# Patient Record
Sex: Male | Born: 1960 | Race: White | Hispanic: No | Smoking: Current every day smoker
Health system: Southern US, Community
[De-identification: ages and names within clinical notes are randomized; demographics above are authoritative.]

## PROBLEM LIST (undated history)

## (undated) DIAGNOSIS — M549 Dorsalgia, unspecified: Secondary | ICD-10-CM

## (undated) DIAGNOSIS — I10 Essential (primary) hypertension: Secondary | ICD-10-CM

## (undated) DIAGNOSIS — N200 Calculus of kidney: Secondary | ICD-10-CM

## (undated) HISTORY — PX: BACK SURGERY: SHX140

## (undated) HISTORY — PX: TONSILLECTOMY: SUR1361

---

## 2005-04-03 ENCOUNTER — Emergency Department: Payer: Self-pay | Admitting: Emergency Medicine

## 2005-04-03 ENCOUNTER — Other Ambulatory Visit: Payer: Self-pay

## 2006-05-04 ENCOUNTER — Emergency Department: Payer: Self-pay | Admitting: Emergency Medicine

## 2006-05-04 ENCOUNTER — Other Ambulatory Visit: Payer: Self-pay

## 2006-11-06 ENCOUNTER — Ambulatory Visit: Payer: Self-pay | Admitting: Unknown Physician Specialty

## 2006-11-06 ENCOUNTER — Other Ambulatory Visit: Payer: Self-pay

## 2006-11-13 ENCOUNTER — Inpatient Hospital Stay: Payer: Self-pay | Admitting: Unknown Physician Specialty

## 2007-11-20 ENCOUNTER — Ambulatory Visit: Payer: Self-pay | Admitting: Unknown Physician Specialty

## 2008-03-20 ENCOUNTER — Emergency Department: Payer: Self-pay | Admitting: Emergency Medicine

## 2009-02-10 ENCOUNTER — Emergency Department: Payer: Self-pay

## 2009-07-18 ENCOUNTER — Emergency Department (HOSPITAL_COMMUNITY): Admission: EM | Admit: 2009-07-18 | Discharge: 2009-07-19 | Payer: Self-pay | Admitting: Emergency Medicine

## 2011-03-20 ENCOUNTER — Emergency Department: Payer: Self-pay | Admitting: Emergency Medicine

## 2011-03-20 LAB — CBC
HGB: 15.6 g/dL (ref 13.0–18.0)
MCHC: 34.3 g/dL (ref 32.0–36.0)
RDW: 13.2 % (ref 11.5–14.5)
WBC: 10.3 10*3/uL (ref 3.8–10.6)

## 2011-03-20 LAB — BASIC METABOLIC PANEL
BUN: 16 mg/dL (ref 7–18)
Calcium, Total: 8.8 mg/dL (ref 8.5–10.1)
EGFR (African American): >60
EGFR (Non-African Amer.): >60
Glucose: 94 mg/dL (ref 65–99)
Osmolality: 282 (ref 275–301)

## 2011-03-20 LAB — TROPONIN I: Troponin-I: <0.02 ng/mL

## 2012-02-28 ENCOUNTER — Emergency Department: Payer: Self-pay | Admitting: Emergency Medicine

## 2012-02-28 LAB — BASIC METABOLIC PANEL
BUN: 15 mg/dL (ref 7–18)
Calcium, Total: 9.3 mg/dL (ref 8.5–10.1)
Chloride: 104 mmol/L (ref 98–107)
Co2: 28 mmol/L (ref 21–32)
Creatinine: 1.21 mg/dL (ref 0.60–1.30)
EGFR (African American): >60
EGFR (Non-African Amer.): >60
Osmolality: 279 (ref 275–301)
Sodium: 138 mmol/L (ref 136–145)

## 2012-02-28 LAB — CBC
MCV: 93 fL (ref 80–100)
Platelet: 330 10*3/uL (ref 150–440)
RBC: 5.09 10*6/uL (ref 4.40–5.90)
WBC: 16.6 10*3/uL — ABNORMAL HIGH (ref 3.8–10.6)

## 2012-02-28 LAB — TROPONIN I: Troponin-I: <0.02 ng/mL

## 2012-04-25 ENCOUNTER — Emergency Department: Payer: Self-pay | Admitting: Internal Medicine

## 2012-04-25 LAB — DRUG SCREEN, URINE
Amphetamines, Ur Screen: NEGATIVE (ref ?–1000)
Barbiturates, Ur Screen: NEGATIVE (ref ?–200)
Benzodiazepine, Ur Scrn: POSITIVE (ref ?–200)
Cannabinoid 50 Ng, Ur ~~LOC~~: NEGATIVE (ref ?–50)
Cocaine Metabolite,Ur ~~LOC~~: POSITIVE (ref ?–300)
Opiate, Ur Screen: NEGATIVE (ref ?–300)
Tricyclic, Ur Screen: NEGATIVE (ref ?–1000)

## 2012-04-25 LAB — ETHANOL: Ethanol %: <0.003 % (ref 0.000–0.080)

## 2012-04-25 LAB — CBC
MCHC: 33.3 g/dL (ref 32.0–36.0)
RDW: 13 % (ref 11.5–14.5)

## 2013-11-11 ENCOUNTER — Emergency Department: Payer: Self-pay | Admitting: Emergency Medicine

## 2013-11-11 LAB — BASIC METABOLIC PANEL
ANION GAP: 5 — AB (ref 7–16)
BUN: 22 mg/dL — ABNORMAL HIGH (ref 7–18)
CALCIUM: 8.9 mg/dL (ref 8.5–10.1)
CO2: 27 mmol/L (ref 21–32)
CREATININE: 1.14 mg/dL (ref 0.60–1.30)
Chloride: 109 mmol/L — ABNORMAL HIGH (ref 98–107)
EGFR (African American): >60
EGFR (Non-African Amer.): >60
Glucose: 96 mg/dL (ref 65–99)
OSMOLALITY: 284 (ref 275–301)
POTASSIUM: 4.1 mmol/L (ref 3.5–5.1)
Sodium: 141 mmol/L (ref 136–145)

## 2013-11-11 LAB — CBC
HCT: 44.8 % (ref 40.0–52.0)
HGB: 15.1 g/dL (ref 13.0–18.0)
MCH: 32.2 pg (ref 26.0–34.0)
MCHC: 33.7 g/dL (ref 32.0–36.0)
MCV: 96 fL (ref 80–100)
Platelet: 293 10*3/uL (ref 150–440)
RBC: 4.69 10*6/uL (ref 4.40–5.90)
RDW: 13.6 % (ref 11.5–14.5)
WBC: 12.8 10*3/uL — ABNORMAL HIGH (ref 3.8–10.6)

## 2013-11-11 LAB — TROPONIN I

## 2014-01-15 ENCOUNTER — Emergency Department: Payer: Self-pay | Admitting: Emergency Medicine

## 2014-04-08 ENCOUNTER — Emergency Department: Payer: Self-pay | Admitting: Emergency Medicine

## 2014-08-15 ENCOUNTER — Other Ambulatory Visit: Payer: Self-pay

## 2014-08-15 ENCOUNTER — Emergency Department
Admission: EM | Admit: 2014-08-15 | Discharge: 2014-08-15 | Disposition: A | Discharge status: Home / Self Care | Payer: Self-pay | Attending: Emergency Medicine | Admitting: Emergency Medicine

## 2014-08-15 ENCOUNTER — Encounter: Payer: Self-pay | Admitting: Emergency Medicine

## 2014-08-15 ENCOUNTER — Emergency Department: Payer: Self-pay

## 2014-08-15 DIAGNOSIS — Z720 Tobacco use: Secondary | ICD-10-CM | POA: Insufficient documentation

## 2014-08-15 DIAGNOSIS — R51 Headache: Secondary | ICD-10-CM | POA: Insufficient documentation

## 2014-08-15 DIAGNOSIS — I10 Essential (primary) hypertension: Secondary | ICD-10-CM | POA: Insufficient documentation

## 2014-08-15 DIAGNOSIS — R0789 Other chest pain: Secondary | ICD-10-CM | POA: Insufficient documentation

## 2014-08-15 DIAGNOSIS — R519 Headache, unspecified: Secondary | ICD-10-CM

## 2014-08-15 DIAGNOSIS — R079 Chest pain, unspecified: Secondary | ICD-10-CM

## 2014-08-15 HISTORY — DX: Essential (primary) hypertension: I10

## 2014-08-15 HISTORY — DX: Dorsalgia, unspecified: M54.9

## 2014-08-15 LAB — CBC
HEMATOCRIT: 46.7 % (ref 40.0–52.0)
Hemoglobin: 15.4 g/dL (ref 13.0–18.0)
MCH: 30.6 pg (ref 26.0–34.0)
MCHC: 33 g/dL (ref 32.0–36.0)
MCV: 92.9 fL (ref 80.0–100.0)
PLATELETS: 274 10*3/uL (ref 150–440)
RBC: 5.03 MIL/uL (ref 4.40–5.90)
RDW: 14.6 % — AB (ref 11.5–14.5)
WBC: 10.8 10*3/uL — AB (ref 3.8–10.6)

## 2014-08-15 LAB — COMPREHENSIVE METABOLIC PANEL
ALBUMIN: 4.4 g/dL (ref 3.5–5.0)
ALT: 453 U/L — ABNORMAL HIGH (ref 17–63)
ANION GAP: 10 (ref 5–15)
AST: 214 U/L — ABNORMAL HIGH (ref 15–41)
Alkaline Phosphatase: 139 U/L — ABNORMAL HIGH (ref 38–126)
BILIRUBIN TOTAL: 1.1 mg/dL (ref 0.3–1.2)
BUN: 34 mg/dL — ABNORMAL HIGH (ref 6–20)
CALCIUM: 9.5 mg/dL (ref 8.9–10.3)
CHLORIDE: 106 mmol/L (ref 101–111)
CO2: 24 mmol/L (ref 22–32)
Creatinine, Ser: 1.05 mg/dL (ref 0.61–1.24)
GFR calc Af Amer: >60 mL/min (ref >60)
GFR calc non Af Amer: >60 mL/min (ref >60)
GLUCOSE: 117 mg/dL — AB (ref 65–99)
POTASSIUM: 3.8 mmol/L (ref 3.5–5.1)
Sodium: 140 mmol/L (ref 135–145)
Total Protein: 7.7 g/dL (ref 6.5–8.1)

## 2014-08-15 LAB — TROPONIN I: Troponin I: <0.03 ng/mL

## 2014-08-15 MED ORDER — LORAZEPAM 2 MG/ML IJ SOLN
INTRAMUSCULAR | Status: AC
  Filled 2014-08-15: qty 1

## 2014-08-15 MED ORDER — BUTALBITAL-APAP-CAFFEINE 50-325-40 MG PO TABS: 1.0000 | ORAL_TABLET | Freq: Four times a day (QID) | ORAL | Status: DC | PRN

## 2014-08-15 MED ORDER — DIAZEPAM 5 MG/ML IJ SOLN
10.0000 mg | Freq: Once | INTRAMUSCULAR | Status: AC
  Administered 2014-08-15: 10 mg via INTRAVENOUS

## 2014-08-15 NOTE — ED Notes (Signed)
Iv d/c'd pt requesting pain rx - dr gave one for fiorcet and f/u to pcp

## 2014-08-15 NOTE — ED Notes (Signed)
Pt agitated, jumpy, states has not slept in 2 days, med for relaxation, drifts to sleep, skin warm and dry and pink.

## 2014-08-15 NOTE — ED Provider Notes (Signed)
[facility] Emergency Department Provider Note  ____________________________________________  Time seen: Approximately 10 AM  I have reviewed the triage vital signs and the nursing notes.   HISTORY  Chief Complaint Chest Pain    HPI Johnny Combs is a 54 y.o. male with a history of back pain and hypertension who presents to the emergency department today with chest pain and a right-sided headache. 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 He says that he injects in his right arm. At 6 AM this morning he began having sharp right-sided chest pain as well as a right-sided headache which she rates as a 8 out of 10. The chest pain radiates to the left side. There is no associated shortness of breath nausea or vomiting. Eyes any abdominal pain. Headache was sudden onset. Says he is concerned because his father died of an aneurysm in his late 44s.   Past Medical History  Diagnosis Date  . Back pain   . Hypertension     There are no active problems to display for this patient.   Past Surgical History  Procedure Laterality Date  . Back surgery      Current Outpatient Rx  Name  Route  Sig  Dispense  Refill  . oxyCODONE-acetaminophen (PERCOCET) 10-325 MG per tablet   Oral   Take 1 tablet by mouth 2 (two) times daily as needed for pain.           Allergies Bee venom and Demerol  No family history on file.  Social History History  Substance Use Topics  . Smoking status: Current Every Day Smoker -- 2.00 packs/day    Types: Cigarettes  . Smokeless tobacco: Not on file  . Alcohol Use: 3.6 oz/week    6 Cans of beer per week    Review of Systems Constitutional: No fever/chills Eyes: No visual changes. ENT: No sore throat. Cardiovascular: As above  Respiratory: Denies shortness of breath. Gastrointestinal: No abdominal pain.  No nausea, no vomiting.  No diarrhea.  No  constipation. Genitourinary: Negative for dysuria. Musculoskeletal: Negative for back pain. Skin: Negative for rash. Neurological: Negative for focal weakness or numbness.  10-point ROS otherwise negative.  ____________________________________________   PHYSICAL EXAM:  VITAL SIGNS: ED Triage Vitals  Enc Vitals Group     BP 08/15/14 0936 129/76 mmHg     Pulse Rate 08/15/14 0936 90     Resp 08/15/14 0936 20     Temp 08/15/14 0936 98.1 F (36.7 C)     Temp Source 08/15/14 0936 Oral     SpO2 08/15/14 0936 97 %     Weight 08/15/14 0936 185 lb (83.915 kg)     Height 08/15/14 0936 5\' 9"  (1.753 m)     Head Cir --      Peak Flow --      Pain Score 08/15/14 0937 8     Pain Loc --      Pain Edu? --      Excl. in GC? --     Constitutional: Alert and oriented.  Eyes: Conjunctivae are normal. PERRL. EOMI. Head: Atraumatic. Nose: No congestion/rhinnorhea. Mouth/Throat: Mucous membranes are moist.  Oropharynx non-erythematous. Neck: No stridor.   Cardiovascular: Normal rate, regular rhythm. Grossly normal heart sounds.  Good peripheral circulation with intact pulses to the bilateral radials and dorsalis pedis.  Respiratory: Normal respiratory effort.  No retractions. Lungs CTAB. Gastrointestinal: Soft and nontender. No distention.  No abdominal bruits. No CVA tenderness. Musculoskeletal: No lower extremity tenderness nor edema.  No joint effusions. Neurologic:  Normal speech and language. No gross focal neurologic deficits are appreciated. Speech is normal. No gait instability. Skin:  Skin is warm, dry and intact. No rash noted. Psychiatric: Mood and affect are normal. Speech and behavior are normal.  ____________________________________________   LABS (all labs ordered are listed, but only abnormal results are displayed)  Labs Reviewed  CBC - Abnormal; Notable for the following:    WBC 10.8 (*)    RDW 14.6 (*)    All other components within normal limits  COMPREHENSIVE  METABOLIC PANEL - Abnormal; Notable for the following:    Glucose, Bld 117 (*)    BUN 34 (*)    AST 214 (*)    ALT 453 (*)    Alkaline Phosphatase 139 (*)    All other components within normal limits  TROPONIN I   ____________________________________________  EKG  ED ECG REPORT I, Arelia Longest, the attending physician, personally viewed and interpreted this ECG.   Date: 08/15/2014  EKG Time: 932  Rate: 107  Rhythm: sinus tachycardia  Axis: Normal axis  Intervals:None  ST&T Change: No ST elevations or depressions. No abnormal T-wave inversions.  ____________________________________________  RADIOLOGY  No acute intracranial findings on CT. Stable chest exam without superimposed acute process. ____________________________________________   PROCEDURES    ____________________________________________   INITIAL IMPRESSION / ASSESSMENT AND PLAN / ED COURSE  Pertinent labs & imaging results that were available during my care of the patient were reviewed by me and considered in my medical decision making (see chart for details).  ----------------------------------------- 1:14 PM on 08/15/2014 -----------------------------------------  Patient with pain relief with Valium. Feel that symptoms are largely related to his use of methamphetamine and heroin. The patient agrees with this. I offered the patient a lumbar puncture and CT angiography of the brain and he does not want those at this time. I said that I'm concerned for an aneurysm, similar to that which killed his father. However, the patient would like to go home at this time. Understands the risk of death and disability. Counseled to reduce use of recreational drugs. We'll discharge to home. ____________________________________________   FINAL CLINICAL IMPRESSION(S) / ED DIAGNOSES  Acute headache and chest pain secondary to methamphetamine use. Return visit.    Myrna Blazer, MD 08/15/14 [phone]

## 2014-08-15 NOTE — ED Notes (Addendum)
Pt reports Right side chest pain that goes down left arm that started about 0630 this am. Pt also reports right side head pain. Pt report using Meth and Heroin yesterday.

## 2014-09-10 ENCOUNTER — Emergency Department
Admission: EM | Admit: 2014-09-10 | Discharge: 2014-09-10 | Disposition: A | Discharge status: Home / Self Care | Payer: Self-pay | Attending: Emergency Medicine | Admitting: Emergency Medicine

## 2014-09-10 ENCOUNTER — Encounter: Payer: Self-pay | Admitting: Emergency Medicine

## 2014-09-10 ENCOUNTER — Emergency Department: Payer: Self-pay

## 2014-09-10 DIAGNOSIS — Z87442 Personal history of urinary calculi: Secondary | ICD-10-CM | POA: Insufficient documentation

## 2014-09-10 DIAGNOSIS — I10 Essential (primary) hypertension: Secondary | ICD-10-CM | POA: Insufficient documentation

## 2014-09-10 DIAGNOSIS — R100 Acute abdomen: Secondary | ICD-10-CM | POA: Insufficient documentation

## 2014-09-10 DIAGNOSIS — R109 Unspecified abdominal pain: Secondary | ICD-10-CM

## 2014-09-10 DIAGNOSIS — Z720 Tobacco use: Secondary | ICD-10-CM | POA: Insufficient documentation

## 2014-09-10 HISTORY — DX: Calculus of kidney: N20.0

## 2014-09-10 LAB — CBC
HEMATOCRIT: 46.7 % (ref 40.0–52.0)
HEMOGLOBIN: 15.9 g/dL (ref 13.0–18.0)
MCH: 31.2 pg (ref 26.0–34.0)
MCHC: 34 g/dL (ref 32.0–36.0)
MCV: 91.7 fL (ref 80.0–100.0)
Platelets: 274 10*3/uL (ref 150–440)
RBC: 5.1 MIL/uL (ref 4.40–5.90)
RDW: 14.7 % — ABNORMAL HIGH (ref 11.5–14.5)
WBC: 10 10*3/uL (ref 3.8–10.6)

## 2014-09-10 LAB — URINALYSIS COMPLETE WITH MICROSCOPIC (ARMC ONLY)
Bilirubin Urine: NEGATIVE
Glucose, UA: NEGATIVE mg/dL
Ketones, ur: NEGATIVE mg/dL
Nitrite: NEGATIVE
Protein, ur: NEGATIVE mg/dL
SPECIFIC GRAVITY, URINE: 1.021 (ref 1.005–1.030)
SQUAMOUS EPITHELIAL / LPF: NONE SEEN
pH: 5 (ref 5.0–8.0)

## 2014-09-10 LAB — COMPREHENSIVE METABOLIC PANEL
ALT: 233 U/L — AB (ref 17–63)
ANION GAP: 11 (ref 5–15)
AST: 100 U/L — AB (ref 15–41)
Albumin: 3.9 g/dL (ref 3.5–5.0)
Alkaline Phosphatase: 127 U/L — ABNORMAL HIGH (ref 38–126)
BILIRUBIN TOTAL: 0.6 mg/dL (ref 0.3–1.2)
BUN: 29 mg/dL — AB (ref 6–20)
CALCIUM: 9.1 mg/dL (ref 8.9–10.3)
CHLORIDE: 106 mmol/L (ref 101–111)
CO2: 21 mmol/L — AB (ref 22–32)
CREATININE: 1.06 mg/dL (ref 0.61–1.24)
GFR calc non Af Amer: >60 mL/min (ref >60)
GLUCOSE: 105 mg/dL — AB (ref 65–99)
Potassium: 4.4 mmol/L (ref 3.5–5.1)
SODIUM: 138 mmol/L (ref 135–145)
TOTAL PROTEIN: 7.5 g/dL (ref 6.5–8.1)

## 2014-09-10 MED ORDER — KETOROLAC TROMETHAMINE 60 MG/2ML IM SOLN
60.0000 mg | Freq: Once | INTRAMUSCULAR | Status: DC
  Filled 2014-09-10: qty 2

## 2014-09-10 NOTE — ED Notes (Signed)
Patient became agitated and swearing when told he could have Tylenol per Dr. Mayford Knife. Patient walked out after talking to Dr. Mayford Knife.

## 2014-09-10 NOTE — ED Provider Notes (Signed)
[facility] Emergency Department Provider Note     Time seen: ----------------------------------------- 2:50 PM on 09/10/2014 -----------------------------------------    I have reviewed the triage vital signs and the nursing notes.   HISTORY  Chief Complaint Flank Pain    HPI Johnny Combs is a 54 y.o. male who presents the ER for left flank pain and slight hematuria. Patient states he has history of kidney stones and this feels like same.Patient states his father wanted him to come to the ER to be evaluated. Pain is in the left flank, dull.. Patient states he took some Xanax at home to help the pain. States he threw up one time, has not had any diarrhea or fever.   Past Medical History  Diagnosis Date  . Back pain   . Hypertension   . Kidney stones     There are no active problems to display for this patient.   Past Surgical History  Procedure Laterality Date  . Back surgery    . Tonsillectomy      Allergies Bee venom; Demerol; and Tramadol  Social History History  Substance Use Topics  . Smoking status: Current Every Day Smoker -- 2.00 packs/day    Types: Cigarettes  . Smokeless tobacco: Not on file  . Alcohol Use: 3.6 oz/week    6 Cans of beer per week    Review of Systems Constitutional: Negative for fever. Eyes: Negative for visual changes. ENT: Negative for sore throat. Cardiovascular: Negative for chest pain. Respiratory: Negative for shortness of breath. Gastrointestinal: Positive for abdominal pain and vomiting Genitourinary: Negative for dysuria. Musculoskeletal: Negative for back pain. Skin: Negative for rash. Neurological: Negative for headaches, focal weakness or numbness.  10-point ROS otherwise negative.  ____________________________________________   PHYSICAL EXAM:  VITAL SIGNS: ED Triage Vitals  Enc Vitals Group     BP 09/10/14 1145 106/82 mmHg     Pulse Rate 09/10/14 1145 91     Resp 09/10/14  1145 18     Temp 09/10/14 1145 98.1 F (36.7 C)     Temp Source 09/10/14 1145 Oral     SpO2 09/10/14 1145 96 %     Weight 09/10/14 1145 190 lb (86.183 kg)     Height 09/10/14 1145 5\' 9"  (1.753 m)     Head Cir --      Peak Flow --      Pain Score 09/10/14 1146 8     Pain Loc --      Pain Edu? --      Excl. in GC? --     Constitutional: Alert and oriented. Patient appears drowsy, under the influence of a substance Eyes: Conjunctivae are normal. PERRL. Normal extraocular movements. ENT   Head: Normocephalic and atraumatic.   Nose: No congestion/rhinnorhea.   Mouth/Throat: Mucous membranes are moist.   Neck: No stridor. Cardiovascular: Normal rate, regular rhythm. Normal and symmetric distal pulses are present in all extremities. No murmurs, rubs, or gallops. Respiratory: Normal respiratory effort without tachypnea nor retractions. Breath sounds are clear and equal bilaterally. No wheezes/rales/rhonchi. Gastrointestinal: Soft and nontender. No distention. No abdominal bruits. There is no CVA tenderness. Musculoskeletal: Nontender with normal range of motion in all extremities. No joint effusions.  No lower extremity tenderness nor edema. Neurologic:  Normal speech and language. No gross focal neurologic deficits are appreciated. Speech is normal. No gait instability. Skin:  Skin is warm, dry and intact. No rash noted. Psychiatric: Mood and affect are normal.   ____________________________________________  ED  COURSE:  Pertinent labs & imaging results that were available during my care of the patient were reviewed by me and considered in my medical decision making (see chart for details). Patient with nonspecific symptoms, benign exam. Doubt serious etiology ____________________________________________    LABS (pertinent positives/negatives)  Labs Reviewed  COMPREHENSIVE METABOLIC PANEL - Abnormal; Notable for the following:    CO2 21 (*)    Glucose, Bld 105 (*)     BUN 29 (*)    AST 100 (*)    ALT 233 (*)    Alkaline Phosphatase 127 (*)    All other components within normal limits  CBC - Abnormal; Notable for the following:    RDW 14.7 (*)    All other components within normal limits  URINALYSIS COMPLETEWITH MICROSCOPIC ([facility] ONLY) - Abnormal; Notable for the following:    Color, Urine YELLOW (*)    APPearance HAZY (*)    Hgb urine dipstick 1+ (*)    Leukocytes, UA 1+ (*)    Bacteria, UA RARE (*)    All other components within normal limits    RADIOLOGY Images were viewed by me  KUB patient is constipated, no obvious renal stone.  ____________________________________________  FINAL ASSESSMENT AND PLAN  Abdominal pain  Plan: Patient with labs and imaging as dictated above. Patient angry and cursing because he has not received narcotics. Girlfriend is also here for pain related complaints. He is stable for outpatient follow-up.   Emily Filbert, MD   Emily Filbert, MD 09/10/14 [phone]

## 2014-09-10 NOTE — ED Notes (Signed)
C/o left flank pain and slight hematuria.  Hx kidney stones, reports this feels like same.

## 2014-11-18 ENCOUNTER — Encounter: Payer: Self-pay | Admitting: Emergency Medicine

## 2014-11-18 ENCOUNTER — Emergency Department
Admission: EM | Admit: 2014-11-18 | Discharge: 2014-11-18 | Disposition: A | Discharge status: Home / Self Care | Payer: Self-pay | Attending: Emergency Medicine | Admitting: Emergency Medicine

## 2014-11-18 DIAGNOSIS — K029 Dental caries, unspecified: Secondary | ICD-10-CM | POA: Insufficient documentation

## 2014-11-18 DIAGNOSIS — I10 Essential (primary) hypertension: Secondary | ICD-10-CM | POA: Insufficient documentation

## 2014-11-18 DIAGNOSIS — Z792 Long term (current) use of antibiotics: Secondary | ICD-10-CM | POA: Insufficient documentation

## 2014-11-18 DIAGNOSIS — K047 Periapical abscess without sinus: Secondary | ICD-10-CM

## 2014-11-18 DIAGNOSIS — Z720 Tobacco use: Secondary | ICD-10-CM | POA: Insufficient documentation

## 2014-11-18 MED ORDER — OXYCODONE-ACETAMINOPHEN 5-325 MG PO TABS: 1.0000 | ORAL_TABLET | ORAL | Status: DC | PRN

## 2014-11-18 MED ORDER — AMOXICILLIN 500 MG PO TABS: 500.0000 mg | ORAL_TABLET | Freq: Two times a day (BID) | ORAL | Status: DC

## 2014-11-18 MED ORDER — LIDOCAINE VISCOUS 2 % MT SOLN: 20.0000 mL | OROMUCOSAL | Status: AC | PRN

## 2014-11-18 NOTE — ED Notes (Signed)
Pt here with dental pain for the last several days.

## 2014-11-18 NOTE — ED Provider Notes (Signed)
[facility] Emergency Department Provider Note  ____________________________________________  Time seen: Approximately 6:07 PM  I have reviewed the triage vital signs and the nursing notes.   HISTORY  Chief Complaint No chief complaint on file.   HPI Johnny Combs is a 54 y.o. male presents for evaluation of severe dental pain in his upper gums.Denies any trauma has a history of ongoing dental infections. Has not been able to get tenderness. His pain is 10 over 10   Past Medical History  Diagnosis Date  . Back pain   . Hypertension   . Kidney stones     There are no active problems to display for this patient.   Past Surgical History  Procedure Laterality Date  . Back surgery    . Tonsillectomy      Current Outpatient Rx  Name  Route  Sig  Dispense  Refill  . amoxicillin (AMOXIL) 500 MG tablet   Oral   Take 1 tablet (500 mg total) by mouth 2 (two) times daily.   20 tablet   0   . oxyCODONE-acetaminophen (ROXICET) 5-325 MG tablet   Oral   Take 1-2 tablets by mouth every 4 (four) hours as needed for severe pain.   20 tablet   0     Allergies Bee venom; Demerol; and Tramadol  No family history on file.  Social History Social History  Substance Use Topics  . Smoking status: Current Every Day Smoker -- 2.00 packs/day    Types: Cigarettes  . Smokeless tobacco: Not on file  . Alcohol Use: 3.6 oz/week    6 Cans of beer per week    Review of Systems Constitutional: No fever/chills Eyes: No visual changes. ENT: No sore throat. Positive for dental pain come pain. Cardiovascular: Denies chest pain. Respiratory: Denies shortness of breath. Gastrointestinal: No abdominal pain.  No nausea, no vomiting.  No diarrhea.  No constipation. Genitourinary: Negative for dysuria. Musculoskeletal: Negative for back pain. Skin: Negative for rash. Neurological: Negative for headaches, focal weakness or numbness.  10-point ROS otherwise  negative.  ____________________________________________   PHYSICAL EXAM:  VITAL SIGNS: ED Triage Vitals  Enc Vitals Group     BP --      Pulse --      Resp --      Temp --      Temp src --      SpO2 --      Weight --      Height --      Head Cir --      Peak Flow --      Pain Score --      Pain Loc --      Pain Edu? --      Excl. in GC? --     Constitutional: Alert and oriented. Well appearing and in no acute distress. Nose: No congestion/rhinnorhea. Mouth/Throat: Mucous membranes are moist.  Oropharynx non-erythematous. Obvious dental caries throughout the mouth with gums are erythematous at the central upper incisors. Underneath the upper lip Neck: No stridor.   Cardiovascular: Normal rate, regular rhythm. Grossly normal heart sounds.  Good peripheral circulation. Respiratory: Normal respiratory effort.  No retractions. Lungs CTAB. Musculoskeletal: No lower extremity tenderness nor edema.  No joint effusions. Neurologic:  Normal speech and language. No gross focal neurologic deficits are appreciated. No gait instability. Skin:  Skin is warm, dry and intact. No rash noted. Psychiatric: Mood and affect are normal. Speech and behavior are normal.  ____________________________________________  LABS (all labs ordered are listed, but only abnormal results are displayed)  Labs Reviewed - No data to display ____________________________________________   PROCEDURES  Procedure(s) performed: None  Critical Care performed: No  ____________________________________________   INITIAL IMPRESSION / ASSESSMENT AND PLAN / ED COURSE  Pertinent labs & imaging results that were available during my care of the patient were reviewed by me and considered in my medical decision making (see chart for details).  Acute dental abscess. Rx given for amoxicillin 500 mg 3 times a day and Percocet 5/325. Patient follow-up with dentist, list given. Patient voices no other emergency  medical complaints at this time. ____________________________________________   FINAL CLINICAL IMPRESSION(S) / ED DIAGNOSES  Final diagnoses:  Dental abscess      Evangeline Dakin, PA-C 11/18/14 1814  Charmayne Sheer Maymunah Stegemann, PA-C 11/18/14 1814  Sharyn Creamer, MD 11/27/14 [phone]

## 2014-11-18 NOTE — Discharge Instructions (Signed)
Dental Abscess °A dental abscess is a collection of infected fluid (pus) from a bacterial infection in the inner part of the tooth (pulp). It usually occurs at the end of the tooth's root.  °CAUSES  °· Severe tooth decay. °· Trauma to the tooth that allows bacteria to enter into the pulp, such as a broken or chipped tooth. °SYMPTOMS  °· Severe pain in and around the infected tooth. °· Swelling and redness around the abscessed tooth or in the mouth or face. °· Tenderness. °· Pus drainage. °· Bad breath. °· Bitter taste in the mouth. °· Difficulty swallowing. °· Difficulty opening the mouth. °· Nausea. °· Vomiting. °· Chills. °· Swollen neck glands. °DIAGNOSIS  °· A medical and dental history will be taken. °· An examination will be performed by tapping on the abscessed tooth. °· X-rays may be taken of the tooth to identify the abscess. °TREATMENT °The goal of treatment is to eliminate the infection. You may be prescribed antibiotic medicine to stop the infection from spreading. A root canal may be performed to save the tooth. If the tooth cannot be saved, it may be pulled (extracted) and the abscess may be drained.  °HOME CARE INSTRUCTIONS °· Only take over-the-counter or prescription medicines for pain, fever, or discomfort as directed by your caregiver. °· Rinse your mouth (gargle) often with salt water (¼ tsp salt in 8 oz [250 ml] of warm water) to relieve pain or swelling. °· Do not drive after taking pain medicine (narcotics). °· Do not apply heat to the outside of your face. °· Return to your dentist for further treatment as directed. °SEEK MEDICAL CARE IF: °· Your pain is not helped by medicine. °· Your pain is getting worse instead of better. °SEEK IMMEDIATE MEDICAL CARE IF: °· You have a fever or persistent symptoms for more than 2-3 days. °· You have a fever and your symptoms suddenly get worse. °· You have chills or a very bad headache. °· You have problems breathing or swallowing. °· You have trouble  opening your mouth. °· You have swelling in the neck or around the eye. °Document Released: 02/06/2005 Document Revised: 11/01/2011 Document Reviewed: 05/17/2010 °ExitCare® Patient Information ©2015 [employer]. This information is not intended to replace advice given to you by your health care provider. Make sure you discuss any questions you have with your health care provider. ° ° °OPTIONS FOR DENTAL FOLLOW UP CARE ° °[place] Department of Health and Human Services - Local Safety Net Dental Clinics °http://www.ncdhhs.gov/dph/oralhealth/services/safetynetclinics.htm °  °[facility] (336-562-3123) ° °[facility] [place] (919-933-9087) ° °[facility] (919-663-1744 ext [phone]) ° °[facility] (336-570-6415) ° °[facility] (919-968-2025) °This clinic caters to the indigent population and is on a lottery system. °Location: °[facility], [address], [place] °Clinic Hours: °Wednesdays from 6pm - 9pm, patients seen by a lottery system. °For dates, call or go to www.med.[facility].edu/[facility]/patients/Dental-[facility] °Services: °Cleanings, fillings and simple extractions. °Payment Options: °DENTAL WORK IS FREE OF CHARGE. Bring proof of income or support. °Best way to get seen: °Arrive at 5:15 pm - this is a lottery, NOT first come/first serve, so arriving earlier will not increase your chances of being seen. °  °  °[facility] °919-537-3737 °Select option 1 for emergencies °  °Location: °[facility], [address], [place] °Clinic Hours: °No walk-ins accepted - call the day before to schedule an appointment. °Check in times   are 9:30 am and 1:30 pm. °Services: °Simple extractions, temporary fillings, pulpectomy/pulp debridement, uncomplicated abscess drainage. °Payment Options: °PAYMENT IS DUE AT THE TIME OF SERVICE.  Fee is usually $100-200, additional surgical procedures (e.g. abscess drainage) may  be extra. °Cash, checks, Visa/MasterCard accepted.  Can file Medicaid if patient is covered for dental - patient should call case worker to check. °No discount for [facility] patients. °Best way to get seen: °MUST call the day before and get onto the schedule. Can usually be seen the next 1-2 days. No walk-ins accepted. °  °  °[facility] °919-933-9087 °  °Location: °[facility], [address], [place] °Clinic Hours: °M, W, Th, F 8am or 1:30pm, Tues 9a or 1:30 - first come/first served. °Services: °Simple extractions, temporary fillings, uncomplicated abscess drainage.  You do not need to be an [place] resident. °Payment Options: °PAYMENT IS DUE AT THE TIME OF SERVICE. °Dental insurance, otherwise sliding scale - bring proof of income or support. °Depending on income and treatment needed, cost is usually $50-200. °Best way to get seen: °Arrive early as it is first come/first served. °  °  °[facility] °919-542-1641 °  °Location: °[address] °Clinic Hours: °Mon-Thu 8a-5p °Services: °Most basic dental services including extractions and fillings. °Payment Options: °PAYMENT IS DUE AT THE TIME OF SERVICE. °Sliding scale, up to 50% off - bring proof if income or support. °Medicaid with dental option accepted. °Best way to get seen: °Call to schedule an appointment, can usually be seen within 2 weeks OR they will try to see walk-ins - show up at 8a or 2p (you may have to wait). °  °  °[facility] °919-245-2435 °[place] RESIDENTS ONLY °  °Location: °[facility], [address], [place], [place] [address] °Clinic Hours: By appointment only. °Monday - Thursday 8am-5pm, Friday 8am-12pm °Services: Cleanings, fillings, extractions. °Payment Options: °PAYMENT IS DUE AT THE TIME OF SERVICE. °Cash, Visa or MasterCard. Sliding scale - $30 minimum per service. °Best way to get seen: °Come in to  office, complete packet and make an appointment - need proof of income °or support monies for each household member and proof of [place] residence. °Usually takes about a month to get in. °  °  °[facility] °[phone] °  °Location: °[address] [place] °Clinic Hours: Walk-in Urgent Care Dental Services are offered Monday-Friday mornings only. °The numbers of emergencies accepted daily is limited to the number of °providers available. °Maximum 15 - Mondays, Wednesdays & Thursdays °Maximum 10 - Tuesdays & Fridays °Services: °You do not need to be a [place] resident to be seen for a dental emergency. °Emergencies are defined as pain, swelling, abnormal bleeding, or dental trauma. Walkins will receive x-rays if needed. °NOTE: Dental cleaning is not an emergency. °Payment Options: °PAYMENT IS DUE AT THE TIME OF SERVICE. °Minimum co-pay is $40.00 for uninsured patients. °Minimum co-pay is $3.00 for Medicaid with dental coverage. °Dental Insurance is accepted and must be presented at time of visit. °Medicare does not cover dental. °Forms of payment: Cash, credit card, checks. °Best way to get seen: °If not previously registered with the clinic, walk-in dental registration begins at 7:15 am and is on a first come/first serve basis. °If previously registered with the clinic, call to make an appointment. °  °  °[facility] °919-776-4359 °[place] RESIDENTS ONLY °  °Location: °[address],   [place], [place] °Clinic Hours: °Mon-Thu 10a-2p °Services: Extractions only! °Payment Options: °FREE (donations accepted) - bring proof of income or support °Best way to get seen: °Call and schedule an appointment OR come at 8am on the 1st Monday of every month (except for holidays) when it is first come/first served. °  °  °[facility] °[phone] °  °Location: °[address], [place] °Clinic Hours: °Friday mornings °Services, Payment Options, Best way to get  seen: °Call for info °

## 2014-11-20 ENCOUNTER — Emergency Department
Admission: EM | Admit: 2014-11-20 | Discharge: 2014-11-20 | Disposition: A | Discharge status: Home / Self Care | Payer: Self-pay | Attending: Emergency Medicine | Admitting: Emergency Medicine

## 2014-11-20 ENCOUNTER — Emergency Department: Payer: Self-pay

## 2014-11-20 ENCOUNTER — Encounter: Payer: Self-pay | Admitting: Emergency Medicine

## 2014-11-20 DIAGNOSIS — R509 Fever, unspecified: Secondary | ICD-10-CM | POA: Insufficient documentation

## 2014-11-20 DIAGNOSIS — K088 Other specified disorders of teeth and supporting structures: Secondary | ICD-10-CM | POA: Insufficient documentation

## 2014-11-20 DIAGNOSIS — H538 Other visual disturbances: Secondary | ICD-10-CM | POA: Insufficient documentation

## 2014-11-20 DIAGNOSIS — I10 Essential (primary) hypertension: Secondary | ICD-10-CM | POA: Insufficient documentation

## 2014-11-20 DIAGNOSIS — R0602 Shortness of breath: Secondary | ICD-10-CM | POA: Insufficient documentation

## 2014-11-20 DIAGNOSIS — Z720 Tobacco use: Secondary | ICD-10-CM | POA: Insufficient documentation

## 2014-11-20 DIAGNOSIS — R1110 Vomiting, unspecified: Secondary | ICD-10-CM | POA: Insufficient documentation

## 2014-11-20 DIAGNOSIS — K0889 Other specified disorders of teeth and supporting structures: Secondary | ICD-10-CM

## 2014-11-20 DIAGNOSIS — R51 Headache: Secondary | ICD-10-CM | POA: Insufficient documentation

## 2014-11-20 DIAGNOSIS — R040 Epistaxis: Secondary | ICD-10-CM | POA: Insufficient documentation

## 2014-11-20 LAB — CBC WITH DIFFERENTIAL/PLATELET
Basophils Absolute: 0.1 10*3/uL (ref 0–0.1)
Basophils Relative: 2 %
Eosinophils Absolute: 0.4 10*3/uL (ref 0–0.7)
Eosinophils Relative: 7 %
HCT: 43.7 % (ref 40.0–52.0)
Hemoglobin: 15.2 g/dL (ref 13.0–18.0)
Lymphocytes Relative: 54 %
Lymphs Abs: 3 10*3/uL (ref 1.0–3.6)
MCH: 32.1 pg (ref 26.0–34.0)
MCHC: 34.8 g/dL (ref 32.0–36.0)
MCV: 92.3 fL (ref 80.0–100.0)
Monocytes Absolute: 1.1 10*3/uL — ABNORMAL HIGH (ref 0.2–1.0)
Monocytes Relative: 20 %
Neutro Abs: 1 10*3/uL — ABNORMAL LOW (ref 1.4–6.5)
Neutrophils Relative %: 17 %
Platelets: 219 10*3/uL (ref 150–440)
RBC: 4.73 MIL/uL (ref 4.40–5.90)
RDW: 13.3 % (ref 11.5–14.5)
WBC: 5.5 10*3/uL (ref 3.8–10.6)

## 2014-11-20 LAB — BASIC METABOLIC PANEL
ANION GAP: 5 (ref 5–15)
BUN: 28 mg/dL — ABNORMAL HIGH (ref 6–20)
CHLORIDE: 106 mmol/L (ref 101–111)
CO2: 26 mmol/L (ref 22–32)
CREATININE: 1.03 mg/dL (ref 0.61–1.24)
Calcium: 9.1 mg/dL (ref 8.9–10.3)
GFR calc non Af Amer: >60 mL/min (ref >60)
Glucose, Bld: 122 mg/dL — ABNORMAL HIGH (ref 65–99)
Potassium: 3.6 mmol/L (ref 3.5–5.1)
SODIUM: 137 mmol/L (ref 135–145)

## 2014-11-20 MED ORDER — SULFAMETHOXAZOLE-TRIMETHOPRIM 800-160 MG PO TABS: 1.0000 | ORAL_TABLET | Freq: Two times a day (BID) | ORAL | Status: AC

## 2014-11-20 MED ORDER — OXYCODONE-ACETAMINOPHEN 10-325 MG PO TABS: 1.0000 | ORAL_TABLET | Freq: Four times a day (QID) | ORAL | Status: DC | PRN

## 2014-11-20 MED ORDER — HYDROMORPHONE HCL 1 MG/ML IJ SOLN
1.0000 mg | Freq: Once | INTRAMUSCULAR | Status: AC
  Administered 2014-11-20: 1 mg via INTRAVENOUS
  Filled 2014-11-20: qty 1

## 2014-11-20 MED ORDER — IOHEXOL 300 MG/ML  SOLN
75.0000 mL | Freq: Once | INTRAMUSCULAR | Status: AC | PRN
  Administered 2014-11-20: 75 mL via INTRAVENOUS
  Filled 2014-11-20: qty 75

## 2014-11-20 NOTE — ED Notes (Signed)
Lab notified to add blood work BMP

## 2014-11-20 NOTE — ED Notes (Signed)
AAOx3.  Skin warm and dry.  NAD. Ambulates with easy and steady gait.  Moving all extremities equally and strong. 

## 2014-11-20 NOTE — ED Provider Notes (Addendum)
[facility] Emergency Department Provider Note  ____________________________________________  Time seen: Approximately 4:20 PM  I have reviewed the triage vital signs and the nursing notes.   HISTORY  Chief Complaint Oral Swelling   HPI Johnny Combs is a 54 y.o. male presenting with oral swelling and dental pain. Patient was seen two days ago in the ED for dental abscess when he was prescribed amoxicillin and percocet. Patient did not follow up with dentist because "I was stubborn and wanted to finish my work project." He is hopeful that he can make an appointment with a dentist on Monday. Since his visit two days ago, he has had continued pain 8/10 "all over" mouth and face. Patient states that he has increased swelling. Patient finds it difficult to eat and drink. He is also experiencing fever (100F yesterday), chills, blurry vision, epistaxis on the right, vomiting x 2, and SOB. Rx Percocet and Lidocaine did not relieve the pain. Patient also tried advil and tylenol, but these did not relieve the pain.    Past Medical History  Diagnosis Date  . Back pain   . Hypertension   . Kidney stones     There are no active problems to display for this patient.   Past Surgical History  Procedure Laterality Date  . Back surgery    . Tonsillectomy      Current Outpatient Rx  Name  Route  Sig  Dispense  Refill  . lidocaine (XYLOCAINE) 2 % solution   Mouth/Throat   Use as directed 20 mLs in the mouth or throat as needed for mouth pain (apply to affected gums as needed for pain.).   100 mL   0   . oxyCODONE-acetaminophen (PERCOCET) 10-325 MG tablet   Oral   Take 1 tablet by mouth every 6 (six) hours as needed for pain.   20 tablet   0   . sulfamethoxazole-trimethoprim (BACTRIM DS,SEPTRA DS) 800-160 MG tablet   Oral   Take 1 tablet by mouth 2 (two) times daily.   20 tablet   0     Allergies Bee venom; Demerol; and Tramadol  No family history on  file.  Social History Social History  Substance Use Topics  . Smoking status: Current Every Day Smoker -- 2.00 packs/day    Types: Cigarettes  . Smokeless tobacco: None  . Alcohol Use: 3.6 oz/week    6 Cans of beer per week    Review of Systems Constitutional: Positive for fever/chills Eyes: Positive for blurry vision. ENT: Positive for throat swelling and sore throat. Cardiovascular: Denies chest pain. Positive palpitations with pain.  Respiratory: Positive for shortness of breath, especially when pain is severe Gastrointestinal: No abdominal pain.  No nausea. Positive for vomiting x2.  No diarrhea.  No constipation. Genitourinary: Negative for dysuria. Musculoskeletal: Negative for back pain. Skin: Negative for rash. Neurological: Positive for headaches. No focal weakness or numbness.  10-point ROS otherwise negative.  ____________________________________________   PHYSICAL EXAM:  VITAL SIGNS: ED Triage Vitals  Enc Vitals Group     BP 11/20/14 1601 130/87 mmHg     Pulse Rate 11/20/14 1601 93     Resp 11/20/14 1601 20     Temp 11/20/14 1601 97.6 F (36.4 C)     Temp Source 11/20/14 1601 Oral     SpO2 11/20/14 1601 97 %     Weight 11/20/14 1601 185 lb (83.915 kg)     Height 11/20/14 1601  (1.778 m)  Head Cir --      Peak Flow --      Pain Score 11/20/14 1603 8     Pain Loc --      Pain Edu? --      Excl. in GC? --     Constitutional: Alert and oriented. Well appearing and in no acute distress. Eyes: Conjunctivae are normal. PERRL. EOMI. Head: Swelling noted to face particularly around mouth. No erythema or bruising noted Nose: Dried blood noted in right nostril.  Mouth/Throat: Mucous membranes are moist.  Dental caries noted. Upper gums appear erythematous. Patient tender to the touch at front and underneath top lip. Oropharynx non-erythematous. Neck: No stridor. Tender to palpation. Cervical LAD.  Cardiovascular: Normal rate, regular rhythm. Grossly  normal heart sounds.  Good peripheral circulation. Respiratory: Normal respiratory effort.  No retractions. Lungs CTAB. Musculoskeletal: No lower extremity tenderness nor edema.  No joint effusions. Neurologic:  Normal speech and language. No gross focal neurologic deficits are appreciated. No gait instability. Skin:  Skin is warm, dry and intact. No rash noted. Psychiatric: Mood and affect are normal. Speech and behavior are normal.  ____________________________________________   LABS (all labs ordered are listed, but only abnormal results are displayed)  Labs Reviewed  CBC WITH DIFFERENTIAL/PLATELET - Abnormal; Notable for the following:    Neutro Abs 1.0 (*)    Monocytes Absolute 1.1 (*)    All other components within normal limits  BASIC METABOLIC PANEL - Abnormal; Notable for the following:    Glucose, Bld 122 (*)    BUN 28 (*)    All other components within normal limits   ____________________________________________   RADIOLOGY  CT Maxillofacial With Contrast: No facial abscess noted. ____________________________________________   PROCEDURES  Procedure(s) performed: None  Critical Care performed: No  ____________________________________________   INITIAL IMPRESSION / ASSESSMENT AND PLAN / ED COURSE  Pertinent labs & imaging results that were available during my care of the patient were reviewed by me and considered in my medical decision making (see chart for details).  54 year old male presenting with continued dental pain and oral swelling since diagnosis of dental abscess two days ago in the ED. Patient has been taking Amoxicillin and Percocet. Pain has continued and swelling has worsened along with development of chills, vomiting, blurry vision, and SOB. Patient has not followed up with dentist.   In the ED, 1 mg Dilaudid IV administered for pain relief. CBC shows. CT Maxillofacial shows no facial abscess. Rx given for Percocet 10/325 #20 and Bactrim DS  twice a day #20. Patient to follow up with dentist on Monday as scheduled.  Patient voices no other emergency medical complaints at this time. Patient to follow up with dentist.  Patient to return to ED if symptoms continue or worsen.  ____________________________________________   FINAL CLINICAL IMPRESSION(S) / ED DIAGNOSES  Final diagnoses:  Pain, dental      Evangeline Dakin, PA-C 11/20/14 1845  Myrna Blazer, MD 11/22/14 0033  Evangeline Dakin, PA-C 11/24/14 1525  Myrna Blazer, MD 11/24/14 [phone]

## 2014-11-20 NOTE — ED Notes (Signed)
Says seen here for dental problems few days ago and was told to return if worse.  Pt with visible swelling upper jaw bilateral.   Pt in nad.

## 2014-11-20 NOTE — ED Notes (Signed)
Increased pain since Wednesday. 10/10 pain. Patient has been taking prescribed medications.

## 2014-11-20 NOTE — Discharge Instructions (Signed)
OPTIONS FOR DENTAL FOLLOW UP CARE ° °[place] [facility] and [facility] - Local Safety Net Dental Clinics °http://www.ncdhhs.gov/dph/oralhealth/services/safetynetclinics.htm °  °[facility] (336-562-3123) ° °[facility] [place] (919-933-9087) ° °[facility] ([phone] ext [phone]) ° °[facility] ([phone]) ° °[facility] ([phone]) °This clinic caters to the indigent population and is on a lottery system. °Location: °[facility], [address], [place] °Clinic Hours: °Wednesdays from 6pm - 9pm, patients seen by a lottery system. °For dates, call or go to [email or web address]/[facility]/patients/Dental-[facility] °Services: °Cleanings, fillings and simple extractions. °Payment Options: °DENTAL WORK IS FREE OF CHARGE. Bring proof of income or support. °Best way to get seen: °Arrive at 5:15 pm - this is a lottery, NOT first come/first serve, so arriving earlier will not increase your chances of being seen. °  °  °[facility] °[phone] °Select option 1 for emergencies °  °Location: °[facility], [address], [place] °Clinic Hours: °No walk-ins accepted - call the day before to schedule an appointment. °Check in times are 9:30 am and 1:30 pm. °Services: °Simple extractions, temporary fillings, pulpectomy/pulp debridement, uncomplicated abscess drainage. °Payment Options: °PAYMENT IS DUE AT THE TIME OF SERVICE.  Fee is usually $100-200, additional surgical procedures (e.g. abscess drainage) may be extra. °Cash, checks, Visa/MasterCard accepted.  Can file Medicaid if patient is covered for dental - patient should call case worker to check. °No discount for [facility] patients. °Best way to get seen: °MUST call the day before and get onto the schedule. Can usually be seen the next 1-2 days. No walk-ins accepted. °  °  °[facility] °[phone] °   °Location: °[facility], [address], [place] °Clinic Hours: °M, W, Th, F 8am or 1:30pm, Tues 9a or 1:30 - first come/first served. °Services: °Simple extractions, temporary fillings, uncomplicated abscess drainage.  You do not need to be an [place] resident. °Payment Options: °PAYMENT IS DUE AT THE TIME OF SERVICE. °Dental insurance, otherwise sliding scale - bring proof of income or support. °Depending on income and treatment needed, cost is usually $50-200. °Best way to get seen: °Arrive early as it is first come/first served. °  °  °[facility] °[phone] °  °Location: °[address] °Clinic Hours: °Mon-Thu 8a-5p °Services: °Most basic dental services including extractions and fillings. °Payment Options: °PAYMENT IS DUE AT THE TIME OF SERVICE. °Sliding scale, up to 50% off - bring proof if income or support. °Medicaid with dental option accepted. °Best way to get seen: °Call to schedule an appointment, can usually be seen within 2 weeks OR they will try to see walk-ins - show up at 8a or 2p (you may have to wait). °  °  °[facility] °[phone] °[place] RESIDENTS ONLY °  °Location: °[facility], [address], [place],  [address] °Clinic Hours: By appointment only. °Monday - Thursday 8am-5pm, Friday 8am-12pm °Services: Cleanings, fillings, extractions. °Payment Options: °PAYMENT IS DUE AT THE TIME OF SERVICE. °Cash, Visa or MasterCard. Sliding scale - $30 minimum per service. °Best way to get seen: °Come in to office, complete packet and make an appointment - need proof of income °or support monies for each household member and proof of [place] residence. °Usually takes about a month to get in. °  °  °[facility] °[phone] °  °Location: °[address]   [place] °Clinic Hours: Walk-in Urgent Care Dental Services are offered Monday-Friday  mornings only. °The numbers of emergencies accepted daily is limited to the number of °providers available. °Maximum 15 - Mondays, Wednesdays & Thursdays °Maximum 10 - Tuesdays & Fridays °Services: °You do not need to be a [place] resident to be seen for a dental emergency. °Emergencies are defined as pain, swelling, abnormal bleeding, or dental trauma. Walkins will receive x-rays if needed. °NOTE: Dental cleaning is not an emergency. °Payment Options: °PAYMENT IS DUE AT THE TIME OF SERVICE. °Minimum co-pay is $40.00 for uninsured patients. °Minimum co-pay is $3.00 for Medicaid with dental coverage. °Dental Insurance is accepted and must be presented at time of visit. °Medicare does not cover dental. °Forms of payment: Cash, credit card, checks. °Best way to get seen: °If not previously registered with the clinic, walk-in dental registration begins at 7:15 am and is on a first come/first serve basis. °If previously registered with the clinic, call to make an appointment. °  °  °[facility] °[phone] °[place] RESIDENTS ONLY °  °Location: °[address], [place], [place] °Clinic Hours: °Mon-Thu 10a-2p °Services: Extractions only! °Payment Options: °FREE (donations accepted) - bring proof of income or support °Best way to get seen: °Call and schedule an appointment OR come at 8am on the 1st Monday of every month (except for holidays) when it is first come/first served. °  °  °[facility] °[phone] °  °Location: °[address], [place] °Clinic Hours: °Friday mornings °Services, Payment Options, Best way to get seen: °Call for info °Dental Pain °A tooth ache may be caused by cavities (tooth decay). Cavities expose the nerve of the tooth to air and hot or cold temperatures. It may come from an infection or abscess (also called a boil or furuncle) around your tooth. It is also often caused by dental caries (tooth decay). This causes the pain you are having. °DIAGNOSIS  °Your caregiver can  diagnose this problem by exam. °TREATMENT  °· If caused by an infection, it may be treated with medications which kill germs (antibiotics) and pain medications as prescribed by your caregiver. Take medications as directed. °· Only take over-the-counter or prescription medicines for pain, discomfort, or fever as directed by your caregiver. °· Whether the tooth ache today is caused by infection or dental disease, you should see your dentist as soon as possible for further care. °SEEK MEDICAL CARE IF: °The exam and treatment you received today has been provided on an emergency basis only. This is not a substitute for complete medical or dental care. If your problem worsens or new problems (symptoms) appear, and you are unable to meet with your dentist, call or return to this location. °SEEK IMMEDIATE MEDICAL CARE IF:  °· You have a fever. °· You develop redness and swelling of your face, jaw, or neck. °· You are unable to open your mouth. °· You have severe pain uncontrolled by pain medicine. °MAKE SURE YOU:  °· Understand these instructions. °· Will watch your condition. °· Will get help right away if you are not doing well or get worse. °Document Released: 02/06/2005 Document Revised: 05/01/2011 Document Reviewed: 09/25/2007 °ExitCare® Patient Information ©2015 [employer]. This information is not intended to replace advice given to you by your health care provider. Make sure you discuss any questions you have with your health care provider. ° °

## 2015-02-04 ENCOUNTER — Emergency Department: Payer: Self-pay

## 2015-02-04 ENCOUNTER — Emergency Department
Admission: EM | Admit: 2015-02-04 | Discharge: 2015-02-04 | Disposition: A | Discharge status: Home / Self Care | Payer: Self-pay | Attending: Emergency Medicine | Admitting: Emergency Medicine

## 2015-02-04 ENCOUNTER — Encounter: Payer: Self-pay | Admitting: Emergency Medicine

## 2015-02-04 DIAGNOSIS — S299XXA Unspecified injury of thorax, initial encounter: Secondary | ICD-10-CM | POA: Insufficient documentation

## 2015-02-04 DIAGNOSIS — F17210 Nicotine dependence, cigarettes, uncomplicated: Secondary | ICD-10-CM | POA: Insufficient documentation

## 2015-02-04 DIAGNOSIS — S86812A Strain of other muscle(s) and tendon(s) at lower leg level, left leg, initial encounter: Secondary | ICD-10-CM | POA: Insufficient documentation

## 2015-02-04 DIAGNOSIS — Y9289 Other specified places as the place of occurrence of the external cause: Secondary | ICD-10-CM | POA: Insufficient documentation

## 2015-02-04 DIAGNOSIS — I10 Essential (primary) hypertension: Secondary | ICD-10-CM | POA: Insufficient documentation

## 2015-02-04 DIAGNOSIS — W01198A Fall on same level from slipping, tripping and stumbling with subsequent striking against other object, initial encounter: Secondary | ICD-10-CM | POA: Insufficient documentation

## 2015-02-04 DIAGNOSIS — Z79899 Other long term (current) drug therapy: Secondary | ICD-10-CM | POA: Insufficient documentation

## 2015-02-04 DIAGNOSIS — Y9389 Activity, other specified: Secondary | ICD-10-CM | POA: Insufficient documentation

## 2015-02-04 DIAGNOSIS — Y998 Other external cause status: Secondary | ICD-10-CM | POA: Insufficient documentation

## 2015-02-04 DIAGNOSIS — S8392XA Sprain of unspecified site of left knee, initial encounter: Secondary | ICD-10-CM | POA: Insufficient documentation

## 2015-02-04 MED ORDER — HYDROCODONE-ACETAMINOPHEN 5-325 MG PO TABS
2.0000 | ORAL_TABLET | Freq: Once | ORAL | Status: AC
Start: 2015-02-04 — End: 2015-02-04
  Administered 2015-02-04: 2 via ORAL

## 2015-02-04 MED ORDER — HYDROCODONE-ACETAMINOPHEN 5-325 MG PO TABS
ORAL_TABLET | ORAL | Status: AC
Start: 2015-02-04 — End: 2015-02-04
  Administered 2015-02-04: 2 via ORAL
  Filled 2015-02-04: qty 2

## 2015-02-04 MED ORDER — HYDROCODONE-ACETAMINOPHEN 5-325 MG PO TABS: 1.0000 | ORAL_TABLET | ORAL | Status: DC | PRN

## 2015-02-04 MED ORDER — KETOROLAC TROMETHAMINE 30 MG/ML IJ SOLN
30.0000 mg | Freq: Once | INTRAMUSCULAR | Status: AC
  Administered 2015-02-04: 30 mg via INTRAMUSCULAR

## 2015-02-04 MED ORDER — KETOROLAC TROMETHAMINE 30 MG/ML IJ SOLN
INTRAMUSCULAR | Status: AC
  Administered 2015-02-04: 30 mg via INTRAMUSCULAR
  Filled 2015-02-04: qty 1

## 2015-02-04 NOTE — ED Provider Notes (Signed)
[facility] Emergency Department Provider Note  ____________________________________________  Time seen: On arrival  I have reviewed the triage vital signs and the nursing notes.   HISTORY  Chief Complaint Rib Injury and Knee Pain    HPI Johnny Combs is a 54 y.o. male who is building a deck and stepped through some floor joists and landed on his left chest just prior to arrival. He reports his knee also flexed forward. He complains of left anterior chest which is severe with her sleeping pain with deep breathing as well as mild left knee pain although he is ambulatory. He does smoke cigarettes. He denies other injuries. No abdominal pain, no nausea or vomiting.     Past Medical History  Diagnosis Date  . Back pain   . Hypertension   . Kidney stones     There are no active problems to display for this patient.   Past Surgical History  Procedure Laterality Date  . Back surgery    . Tonsillectomy      Current Outpatient Rx  Name  Route  Sig  Dispense  Refill  . lidocaine (XYLOCAINE) 2 % solution   Mouth/Throat   Use as directed 20 mLs in the mouth or throat as needed for mouth pain (apply to affected gums as needed for pain.).   100 mL   0   . oxyCODONE-acetaminophen (PERCOCET) 10-325 MG tablet   Oral   Take 1 tablet by mouth every 6 (six) hours as needed for pain.   20 tablet   0   . sulfamethoxazole-trimethoprim (BACTRIM DS,SEPTRA DS) 800-160 MG tablet   Oral   Take 1 tablet by mouth 2 (two) times daily.   20 tablet   0     Allergies Bee venom; Demerol; and Tramadol  No family history on file.  Social History Social History  Substance Use Topics  . Smoking status: Current Every Day Smoker -- 2.00 packs/day    Types: Cigarettes  . Smokeless tobacco: None  . Alcohol Use: 3.6 oz/week    6 Cans of beer per week    Review of Systems  Constitutional: Negative for fever. Eyes: Negative for visual changes. ENT: Negative  for sore throat Cardiovascular: As above Respiratory: Pain with deep breathing Gastrointestinal: Negative for abdominal pain, vomiting and diarrhea. Genitourinary: Negative for dysuria. Musculoskeletal: Negative for back pain. Mild left knee discomfort Skin: Negative for rash. Neurological: Negative for headaches or focal weakness Psychiatric: No anxiety    ____________________________________________   PHYSICAL EXAM:  VITAL SIGNS: ED Triage Vitals  Enc Vitals Group     BP 02/04/15 1403 142/105 mmHg     Pulse Rate 02/04/15 1403 81     Resp 02/04/15 1403 18     Temp 02/04/15 1403 98.2 F (36.8 C)     Temp Source 02/04/15 1403 Oral     SpO2 02/04/15 1403 94 %     Weight 02/04/15 1403 185 lb (83.915 kg)     Height 02/04/15 1403 5\' 10"  (1.778 m)     Head Cir --      Peak Flow --      Pain Score 02/04/15 1403 10     Pain Loc --      Pain Edu? --      Excl. in GC? --      Constitutional: Alert and oriented. Well appearing and in no distress. Eyes: Conjunctivae are normal.  ENT   Head: Normocephalic and atraumatic.   Mouth/Throat: Mucous membranes  are moist. Cardiovascular: Normal rate, regular rhythm. Normal and symmetric distal pulses are present in all extremities. No murmurs, rubs, or gallops. Tenderness to palpation in the left lower anterior chest which is significant Respiratory: Normal respiratory effort without tachypnea nor retractions. Breath sounds are clear and equal bilaterally.  Gastrointestinal: Soft and non-tender in all quadrants. No distention. There is no CVA tenderness. Genitourinary: deferred Musculoskeletal: Nontender with normal range of motion in all extremities. No lower extremity tenderness nor edema. Left knee exam is unremarkable, ligaments intact, no swelling Neurologic:  Normal speech and language. No gross focal neurologic deficits are appreciated. Skin:  Skin is warm, dry and intact. No rash noted. Psychiatric: Mood and affect are  normal. Patient exhibits appropriate insight and judgment.  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  Despite normal x-ray of the chest patient has significant tenderness we will send for non-con CT chest for further evaluation  CT shows no rib fractures or pulmonary contusion. Discussed incidental findings as above. Recommend ice, pain medication and rest for likely chest wall contusion and knee sprain  ____________________________________________   FINAL CLINICAL IMPRESSION(S) / ED DIAGNOSES  Final diagnoses:  Chest wall injury, initial encounter  Knee sprain and strain, left, initial encounter     Jene Every, MD 02/04/15 [phone]

## 2015-02-04 NOTE — ED Notes (Signed)
Pt states he tripped over some boards at work and tripped, states left sided rib pain and left knee pain, states difficulty breathing when he lies down, MD at bedside

## 2015-02-04 NOTE — ED Notes (Signed)
Pt presents with left rib pain and left knee pain after falling on a floor joist. Pt states it hurts to breath and feels like his lungs are filling with fluid when he lies down.

## 2015-02-04 NOTE — Discharge Instructions (Signed)
No broken bones were seen on CT scan of your chest there is a slightly abnormal appearance of your right middle lobe lung which needs to be further evaluated by bronchoscopy. Please follow-up with your primary care physician to arrange this   Blunt Chest Trauma Blunt chest trauma is an injury caused by a blow to the chest. These chest injuries can be very painful. Blunt chest trauma often results in bruised or broken (fractured) ribs. Most cases of bruised and fractured ribs from blunt chest traumas get better after 1 to 3 weeks of rest and pain medicine. Often, the soft tissue in the chest wall is also injured, causing pain and bruising. Internal organs, such as the heart and lungs, may also be injured. Blunt chest trauma can lead to serious medical problems. This injury requires immediate medical care. CAUSES   Motor vehicle collisions.  Falls.  Physical violence.  Sports injuries. SYMPTOMS   Chest pain. The pain may be worse when you move or breathe deeply.  Shortness of breath.  Lightheadedness.  Bruising.  Tenderness.  Swelling. DIAGNOSIS  Your caregiver will do a physical exam. X-rays may be taken to look for fractures. However, minor rib fractures may not show up on X-rays until a few days after the injury. If a more serious injury is suspected, further imaging tests may be done. This may include ultrasounds, computed tomography (CT) scans, or magnetic resonance imaging (MRI). TREATMENT  Treatment depends on the severity of your injury. Your caregiver may prescribe pain medicines and deep breathing exercises. HOME CARE INSTRUCTIONS  Limit your activities until you can move around without much pain.  Do not do any strenuous work until your injury is healed.  Put ice on the injured area.  Put ice in a plastic bag.  Place a towel between your skin and the bag.  Leave the ice on for 15-20 minutes, 03-04 times a day.  You may wear a rib belt as directed by your  caregiver to reduce pain.  Practice deep breathing as directed by your caregiver to keep your lungs clear.  Only take over-the-counter or prescription medicines for pain, fever, or discomfort as directed by your caregiver. SEEK IMMEDIATE MEDICAL CARE IF:   You have increasing pain or shortness of breath.  You cough up blood.  You have nausea, vomiting, or abdominal pain.  You have a fever.  You feel dizzy, weak, or you faint. MAKE SURE YOU:  Understand these instructions.  Will watch your condition.  Will get help right away if you are not doing well or get worse.   This information is not intended to replace advice given to you by your health care provider. Make sure you discuss any questions you have with your health care provider.   Document Released: 03/16/2004 Document Revised: 02/27/2014 Document Reviewed: 08/05/2014 Elsevier Interactive Patient Education [employer]2016 Elsevier Inc.

## 2015-02-07 ENCOUNTER — Encounter: Payer: Self-pay | Admitting: *Deleted

## 2015-02-07 ENCOUNTER — Emergency Department: Payer: Self-pay

## 2015-02-07 ENCOUNTER — Emergency Department
Admission: EM | Admit: 2015-02-07 | Discharge: 2015-02-07 | Disposition: A | Discharge status: Home / Self Care | Payer: Self-pay | Attending: Emergency Medicine | Admitting: Emergency Medicine

## 2015-02-07 DIAGNOSIS — I10 Essential (primary) hypertension: Secondary | ICD-10-CM | POA: Insufficient documentation

## 2015-02-07 DIAGNOSIS — Y990 Civilian activity done for income or pay: Secondary | ICD-10-CM | POA: Insufficient documentation

## 2015-02-07 DIAGNOSIS — S2232XA Fracture of one rib, left side, initial encounter for closed fracture: Secondary | ICD-10-CM | POA: Insufficient documentation

## 2015-02-07 DIAGNOSIS — W1839XA Other fall on same level, initial encounter: Secondary | ICD-10-CM | POA: Insufficient documentation

## 2015-02-07 DIAGNOSIS — Z792 Long term (current) use of antibiotics: Secondary | ICD-10-CM | POA: Insufficient documentation

## 2015-02-07 DIAGNOSIS — Y9389 Activity, other specified: Secondary | ICD-10-CM | POA: Insufficient documentation

## 2015-02-07 DIAGNOSIS — Y9289 Other specified places as the place of occurrence of the external cause: Secondary | ICD-10-CM | POA: Insufficient documentation

## 2015-02-07 DIAGNOSIS — F17210 Nicotine dependence, cigarettes, uncomplicated: Secondary | ICD-10-CM | POA: Insufficient documentation

## 2015-02-07 MED ORDER — ONDANSETRON HCL 4 MG/2ML IJ SOLN
4.0000 mg | Freq: Once | INTRAMUSCULAR | Status: AC
  Administered 2015-02-07: 4 mg via INTRAVENOUS

## 2015-02-07 MED ORDER — MORPHINE SULFATE (PF) 4 MG/ML IV SOLN
4.0000 mg | Freq: Once | INTRAVENOUS | Status: AC
Start: 2015-02-07 — End: 2015-02-07
  Administered 2015-02-07: 4 mg via INTRAVENOUS

## 2015-02-07 MED ORDER — OXYCODONE-ACETAMINOPHEN 5-325 MG PO TABS: 2.0000 | ORAL_TABLET | Freq: Once | ORAL | Status: DC

## 2015-02-07 MED ORDER — HYDROMORPHONE HCL 1 MG/ML IJ SOLN
1.0000 mg | Freq: Once | INTRAMUSCULAR | Status: AC
  Administered 2015-02-07: 1 mg via INTRAVENOUS

## 2015-02-07 MED ORDER — MORPHINE SULFATE (PF) 4 MG/ML IV SOLN
INTRAVENOUS | Status: AC
  Administered 2015-02-07: 4 mg via INTRAVENOUS
  Filled 2015-02-07: qty 1

## 2015-02-07 MED ORDER — HYDROMORPHONE HCL 1 MG/ML IJ SOLN
INTRAMUSCULAR | Status: AC
  Administered 2015-02-07: 1 mg via INTRAVENOUS
  Filled 2015-02-07: qty 1

## 2015-02-07 MED ORDER — OXYCODONE-ACETAMINOPHEN 5-325 MG PO TABS: 1.0000 | ORAL_TABLET | ORAL | Status: AC | PRN

## 2015-02-07 MED ORDER — OXYCODONE-ACETAMINOPHEN 5-325 MG PO TABS
1.0000 | ORAL_TABLET | Freq: Once | ORAL | Status: AC
  Administered 2015-02-07: 1 via ORAL

## 2015-02-07 MED ORDER — IBUPROFEN 600 MG PO TABS: 600.0000 mg | ORAL_TABLET | Freq: Three times a day (TID) | ORAL | Status: AC | PRN

## 2015-02-07 MED ORDER — ONDANSETRON HCL 4 MG/2ML IJ SOLN
INTRAMUSCULAR | Status: AC
  Administered 2015-02-07: 4 mg via INTRAVENOUS
  Filled 2015-02-07: qty 2

## 2015-02-07 MED ORDER — OXYCODONE-ACETAMINOPHEN 5-325 MG PO TABS
ORAL_TABLET | ORAL | Status: DC
Start: 2015-02-07 — End: 2015-02-08
  Filled 2015-02-07: qty 1

## 2015-02-07 NOTE — ED Notes (Signed)
Patient was seen on Thursday after fall occurred at work and was diagnosed with bruised ribs. Patient states pain has increased and feels like he is short of breath. Patient states prescription pain meds are not effective.

## 2015-02-07 NOTE — ED Provider Notes (Signed)
[facility] Emergency Department Provider Note   ____________________________________________  Time seen: I have reviewed the triage vital signs and the triage nursing note.  HISTORY  Chief Complaint Fall   Historian Patient  HPI Warnell BureauWilliam Qu is a 54 y.o. male who is here for evaluation of left sided chest wall/rib pain. He was evaluated in the emergency department 3 days ago after a fall onto that side and had a CT of the chest which did not show any specific traumatic injury. Patient states that he has had significant chest pain since then on the left side especially with movement and deep breathing. He's coughed up a little bit of blood, which he thinks may be from sinus drainage due to dry heat in home. Pain in left rib margin is severe.    Past Medical History  Diagnosis Date  . Back pain   . Hypertension   . Kidney stones     There are no active problems to display for this patient.   Past Surgical History  Procedure Laterality Date  . Back surgery    . Tonsillectomy      Current Outpatient Rx  Name  Route  Sig  Dispense  Refill  . ibuprofen (ADVIL,MOTRIN) 600 MG tablet   Oral   Take 1 tablet (600 mg total) by mouth every 8 (eight) hours as needed.   20 tablet   0   . lidocaine (XYLOCAINE) 2 % solution   Mouth/Throat   Use as directed 20 mLs in the mouth or throat as needed for mouth pain (apply to affected gums as needed for pain.).   100 mL   0   . oxyCODONE-acetaminophen (ROXICET) 5-325 MG tablet   Oral   Take 1-2 tablets by mouth every 4 (four) hours as needed for severe pain.   15 tablet   0   . sulfamethoxazole-trimethoprim (BACTRIM DS,SEPTRA DS) 800-160 MG tablet   Oral   Take 1 tablet by mouth 2 (two) times daily.   20 tablet   0     Allergies Bee venom; Demerol; and Tramadol  No family history on file.  Social History Social History  Substance Use Topics  . Smoking status: Current Every Day Smoker --  2.00 packs/day    Types: Cigarettes  . Smokeless tobacco: None  . Alcohol Use: 3.6 oz/week    6 Cans of beer per week    Review of Systems  Constitutional: Negative for fever. Eyes: Negative for visual changes. ENT: Negative for sore throat. Cardiovascular: Positive for chest pain, left rib margin. Respiratory: Positive for shortness of breath due to pain at the left side chest wall Gastrointestinal: Negative for abdominal pain, vomiting and diarrhea. Genitourinary: Negative for dysuria. Musculoskeletal: Negative for back pain. Skin: Negative for rash. Neurological: Negative for headache. 10 point Review of Systems otherwise negative ____________________________________________   PHYSICAL EXAM:  VITAL SIGNS: ED Triage Vitals  Enc Vitals Group     BP 02/07/15 1743 120/71 mmHg     Pulse Rate 02/07/15 1743 90     Resp 02/07/15 1743 18     Temp 02/07/15 1743 98.7 F (37.1 C)     Temp Source 02/07/15 1743 Oral     SpO2 02/07/15 1743 97 %     Weight 02/07/15 1743 185 lb (83.915 kg)     Height 02/07/15 1743 5\' 10"  (1.778 m)     Head Cir --      Peak Flow --  Pain Score 02/07/15 1743 10     Pain Loc --      Pain Edu? --      Excl. in GC? --      Constitutional: Alert and oriented. Well appearing, but in pain, holding the left lateral ribs and uncomfortable. Eyes: Conjunctivae are normal. PERRL. Normal extraocular movements. ENT   Head: Normocephalic and atraumatic.   Nose: No congestion/rhinnorhea.   Mouth/Throat: Mucous membranes are moist.   Neck: No stridor. Cardiovascular/Chest: Normal rate, regular rhythm.  No murmurs, rubs, or gallops. Respiratory: Normal respiratory effort without tachypnea nor retractions. Mild decreased breath sounds at bases, seems to be due to effort. Very tender over the lower rib margin on the left lateral chest wall. No skin changes there Gastrointestinal: Soft. No distention, no guarding, no rebound. Nontender   Genitourinary/rectal:Deferred Musculoskeletal: Nontender with normal range of motion in all extremities. No joint effusions.  No lower extremity tenderness.  No edema. Neurologic:  Normal speech and language. No gross or focal neurologic deficits are appreciated. Skin:  Skin is warm, dry and intact. No rash noted. Psychiatric: Mood and affect are normal. Speech and behavior are normal. Patient exhibits appropriate insight and judgment.  ____________________________________________   EKG I, Governor Rooks, MD, the attending physician have personally viewed and interpreted all ECGs.  97 bpm. Normal sinus rhythm. Narrow QRS. Normal axis. Normal ST and T-wave ____________________________________________  LABS (pertinent positives/negatives)  None  ____________________________________________  RADIOLOGY All Xrays were viewed by me. Imaging interpreted by Radiologist.  Chest x-ray two-view: Left anterior ninth rib fracture. Right base scar versus atelectasis __________________________________________  PROCEDURES  Procedure(s) performed: None  Critical Care performed: None  ____________________________________________   ED COURSE / ASSESSMENT AND PLAN  CONSULTATIONS: None  Pertinent labs & imaging results that were available during my care of the patient were reviewed by me and considered in my medical decision making (see chart for details).   Clinically patient having symptoms of rib fracture, in the setting of the recent fall to the left chest wall. Chest x-ray today does show a left anterior rib fracture of rib #9, and this is the exact area where he is hurting.  Symptomatic pain control was given his number chart. I will change his hydrocodone to oxycodone. He knows not to mix these, and we discussed narcotic precautions. He is also asked to be on ibuprofen for one week as well. He was given an incentive spirometer.    Patient / Family / Caregiver informed of clinical  course, medical decision-making process, and agree with plan.   I discussed return precautions, follow-up instructions, and discharged instructions with patient and/or family.  ___________________________________________   FINAL CLINICAL IMPRESSION(S) / ED DIAGNOSES   Final diagnoses:  Left rib fracture, closed, initial encounter       Governor Rooks, MD 02/07/15 2153

## 2015-02-07 NOTE — Discharge Instructions (Signed)
You were evaluated for left side rib pain and found to have left ninth rib fracture. Use incentive spirometer to help prevent pneumonia. Take ibuprofen 3 times per day for baseline pain control, and he may try Percocet for severe pain. Do not take with Vicodin, or other sedative medications, or alcohol.  Return to the emergency room for any worsening condition including any trouble breathing, shortness of breath, worsening pain, weakness, numbness, or abdominal pain.   Rib Fracture A rib fracture is a break or crack in one of the bones of the ribs. The ribs are a group of long, curved bones that wrap around your chest and attach to your spine. They protect your lungs and other organs in the chest cavity. A broken or cracked rib is often painful, but most do not cause other problems. Most rib fractures heal on their own over time. However, rib fractures can be more serious if multiple ribs are broken or if broken ribs move out of place and push against other structures. CAUSES   A direct blow to the chest. For example, this could happen during contact sports, a car accident, or a fall against a hard object.  Repetitive movements with high force, such as pitching a baseball or having severe coughing spells. SYMPTOMS   Pain when you breathe in or cough.  Pain when someone presses on the injured area. DIAGNOSIS  Your caregiver will perform a physical exam. Various imaging tests may be ordered to confirm the diagnosis and to look for related injuries. These tests may include a chest X-ray, computed tomography (CT), magnetic resonance imaging (MRI), or a bone scan. TREATMENT  Rib fractures usually heal on their own in 1-3 months. The longer healing period is often associated with a continued cough or other aggravating activities. During the healing period, pain control is very important. Medication is usually given to control pain. Hospitalization or surgery may be needed for more severe injuries, such  as those in which multiple ribs are broken or the ribs have moved out of place.  HOME CARE INSTRUCTIONS   Avoid strenuous activity and any activities or movements that cause pain. Be careful during activities and avoid bumping the injured rib.  Gradually increase activity as directed by your caregiver.  Only take over-the-counter or prescription medications as directed by your caregiver. Do not take other medications without asking your caregiver first.  Apply ice to the injured area for the first 1-2 days after you have been treated or as directed by your caregiver. Applying ice helps to reduce inflammation and pain.  Put ice in a plastic bag.  Place a towel between your skin and the bag.   Leave the ice on for 15-20 minutes at a time, every 2 hours while you are awake.  Perform deep breathing as directed by your caregiver. This will help prevent pneumonia, which is a common complication of a broken rib. Your caregiver may instruct you to:  Take deep breaths several times a day.  Try to cough several times a day, holding a pillow against the injured area.  Use a device called an incentive spirometer to practice deep breathing several times a day.  Drink enough fluids to keep your urine clear or pale yellow. This will help you avoid constipation.   Do not wear a rib belt or binder. These restrict breathing, which can lead to pneumonia.  SEEK IMMEDIATE MEDICAL CARE IF:   You have a fever.   You have difficulty breathing or shortness  of breath.   You develop a continual cough, or you cough up thick or bloody sputum.  You feel sick to your stomach (nausea), throw up (vomit), or have abdominal pain.   You have worsening pain not controlled with medications.  MAKE SURE YOU:  Understand these instructions.  Will watch your condition.  Will get help right away if you are not doing well or get worse.   This information is not intended to replace advice given to you by  your health care provider. Make sure you discuss any questions you have with your health care provider.   Document Released: 02/06/2005 Document Revised: 10/09/2012 Document Reviewed: 04/10/2012 Elsevier Interactive Patient Education [employer]2016 Elsevier Inc.

## 2015-02-07 NOTE — ED Notes (Signed)
Pt educated and demonstrated learning regarding using incentive spirometer.

## 2015-06-04 ENCOUNTER — Emergency Department
Admission: EM | Admit: 2015-06-04 | Discharge: 2015-06-04 | Disposition: A | Discharge status: Home / Self Care | Payer: Self-pay | Attending: Emergency Medicine | Admitting: Emergency Medicine

## 2015-06-04 ENCOUNTER — Encounter: Payer: Self-pay | Admitting: Emergency Medicine

## 2015-06-04 DIAGNOSIS — I10 Essential (primary) hypertension: Secondary | ICD-10-CM | POA: Insufficient documentation

## 2015-06-04 DIAGNOSIS — F17210 Nicotine dependence, cigarettes, uncomplicated: Secondary | ICD-10-CM | POA: Insufficient documentation

## 2015-06-04 DIAGNOSIS — N200 Calculus of kidney: Secondary | ICD-10-CM | POA: Insufficient documentation

## 2015-06-04 DIAGNOSIS — T401X1A Poisoning by heroin, accidental (unintentional), initial encounter: Secondary | ICD-10-CM | POA: Insufficient documentation

## 2015-06-04 NOTE — ED Notes (Signed)
Per EMS patient was found in hotel non-responsive after being given 6mg  narcan intranasally by [employer], and another 2mg  n[facility]an [facility]ally [facility] [employer]  EMS started a line in right hand and gave another 2mg  IV Narcan and patient came around.  Patient is AOx4, responsive, and cooperative to staff.  Patient asked question about the dose he took and he stated it was "less that usual"

## 2015-06-04 NOTE — ED Provider Notes (Signed)
[facility] Emergency Department Provider Note  ____________________________________________  Time seen: 12:50 AM  I have reviewed the triage vital signs and the nursing notes.   HISTORY  Chief Complaint Drug Overdose     HPI Johnny Combs is a 55 y.o. male presents via EMS found in a hotel unresponsive patient received a total of 8 mg intranasal  Narcan by [employer] without improvement of status on EMS arrival IV access obtained 2 mg of Narcan given with patient regained consciousness. 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   Past Medical History  Diagnosis Date  . Back pain   . Hypertension   . Kidney stones     There are no active problems to display for this patient.   Past Surgical History  Procedure Laterality Date  . Back surgery    . Tonsillectomy    . Back surgery      Current Outpatient Rx  Name  Route  Sig  Dispense  Refill  . ibuprofen (ADVIL,MOTRIN) 600 MG tablet   Oral   Take 1 tablet (600 mg total) by mouth every 8 (eight) hours as needed.   20 tablet   0   . lidocaine (XYLOCAINE) 2 % solution   Mouth/Throat   Use as directed 20 mLs in the mouth or throat as needed for mouth pain (apply to affected gums as needed for pain.).   100 mL   0   . oxyCODONE-acetaminophen (ROXICET) 5-325 MG tablet   Oral   Take 1-2 tablets by mouth every 4 (four) hours as needed for severe pain.   15 tablet   0   . sulfamethoxazole-trimethoprim (BACTRIM DS,SEPTRA DS) 800-160 MG tablet   Oral   Take 1 tablet by mouth 2 (two) times daily.   20 tablet   0     Allergies Bee venom; Demerol; and Tramadol  No family history on file.  Social History Social History  Substance Use Topics  . Smoking status: Current Every Day Smoker -- 2.00 packs/day    Types: Cigarettes  . Smokeless tobacco: None  . Alcohol Use: 3.6 oz/week    6 Cans of beer per week    Review of  Systems  Constitutional: Negative for fever. Eyes: Negative for visual changes. ENT: Negative for sore throat. Cardiovascular: Negative for chest pain. Respiratory: Negative for shortness of breath. Gastrointestinal: Negative for abdominal pain, vomiting and diarrhea. Genitourinary: Negative for dysuria. Musculoskeletal: Negative for back pain. Skin: Negative for rash. Neurological: Negative for headaches, focal weakness or numbness. Psychiatric:Positive for opiate abuse  10-point ROS otherwise negative.  ____________________________________________   PHYSICAL EXAM:  VITAL SIGNS: ED Triage Vitals  Enc Vitals Group     BP 06/04/15 0054 159/107 mmHg     Pulse Rate 06/04/15 0054 101     Resp 06/04/15 0054 20     Temp 06/04/15 0054 98 F (36.7 C)     Temp Source 06/04/15 0054 Oral     SpO2 06/04/15 0050 94 %     Weight --      Height --      Head Cir --      Peak Flow --      Pain Score --      Pain Loc --      Pain Edu? --      Excl. in GC? --      Constitutional: Alert and oriented. Well appearing and in no  distress. Eyes: Conjunctivae are normal. PERRL. Normal extraocular movements. ENT   Head: Normocephalic and atraumatic.   Nose: No congestion/rhinnorhea.   Mouth/Throat: Mucous membranes are moist.   Neck: No stridor. Hematological/Lymphatic/Immunilogical: No cervical lymphadenopathy. Cardiovascular: Normal rate, regular rhythm. Normal and symmetric distal pulses are present in all extremities. No murmurs, rubs, or gallops. Respiratory: Normal respiratory effort without tachypnea nor retractions. Breath sounds are clear and equal bilaterally. No wheezes/rales/rhonchi. Gastrointestinal: Soft and nontender. No distention. There is no CVA tenderness. Genitourinary: deferred Musculoskeletal: Nontender with normal range of motion in all extremities. No joint effusions.  No lower extremity tenderness nor edema. Neurologic:  Normal speech and language. No  gross focal neurologic deficits are appreciated. Speech is normal.  Skin:  Skin is warm, dry and intact. No rash noted. Psychiatric: Mood and affect are normal. Speech and behavior are normal. Patient exhibits appropriate insight and judgment.    EKG  ED ECG REPORT I, [place] Raquel Racey, the attending physician, personally viewed and interpreted this ECG.   Date: 06/04/2015  EKG Time: 12:56 AM  Rate: 103  Rhythm: Sinus tachycardia  Axis: Normal  Intervals: Normal  ST&T Change: None     INITIAL IMPRESSION / ASSESSMENT AND PLAN / ED COURSE  Pertinent labs & imaging results that were available during my care of the patient were reviewed by me and considered in my medical decision making (see chart for details).  Patient observed in the emergency department for 2 hours without any decreased in consciousness, vital signs stable no hypoxia. Patient stated that he wanted to leave the emergency department I explained to him the dangers of doing so in length however the patient was adamant that he wanted to leave  ____________________________________________   FINAL CLINICAL IMPRESSION(S) / ED DIAGNOSES  Final diagnoses:  Heroin overdose, accidental or unintentional, initial encounter      Darci Currentandolph N Lavanda Nevels, MD 06/04/15 [phone]

## 2015-06-04 NOTE — Discharge Instructions (Signed)
Narcotic Overdose °A narcotic overdose is the misuse or overuse of a narcotic drug. A narcotic overdose can make you pass out and stop breathing. If you are not treated right away, this can cause permanent brain damage or stop your heart. Medicine may be given to reverse the effects of an overdose. If so, this medicine may bring on withdrawal symptoms. The symptoms may be abdominal cramps, throwing up (vomiting), sweating, chills, and nervousness. °Injecting narcotics can cause more problems than just an overdose. AIDS, hepatitis, and other very serious infections are transmitted by sharing needles and syringes. If you decide to quit using, there are medicines which can help you through the withdrawal period. Trying to quit all at once on your own can be uncomfortable, but not life-threatening. Call your caregiver, Narcotics Anonymous, or any drug and alcohol treatment program for further help.  °  °This information is not intended to replace advice given to you by your health care provider. Make sure you discuss any questions you have with your health care provider. °  °Document Released: 03/16/2004 Document Revised: 02/27/2014 Document Reviewed: 07/29/2014 °Elsevier Interactive Patient Education ©2016 Elsevier Inc. ° °

## 2015-06-04 NOTE — ED Notes (Signed)
Reviewed d/c instructions, follow-up care with pt. Pt verbalized understanding 

## 2015-06-21 DEATH — deceased

## 2016-02-12 IMAGING — CR DG CHEST 2V
2 series · 2 of 2 positions shown · non-contrast
Comparison: 02/04/2015

CLINICAL DATA: Fall.  Cough.

EXAM:
CHEST  2 VIEW

[chest pa]
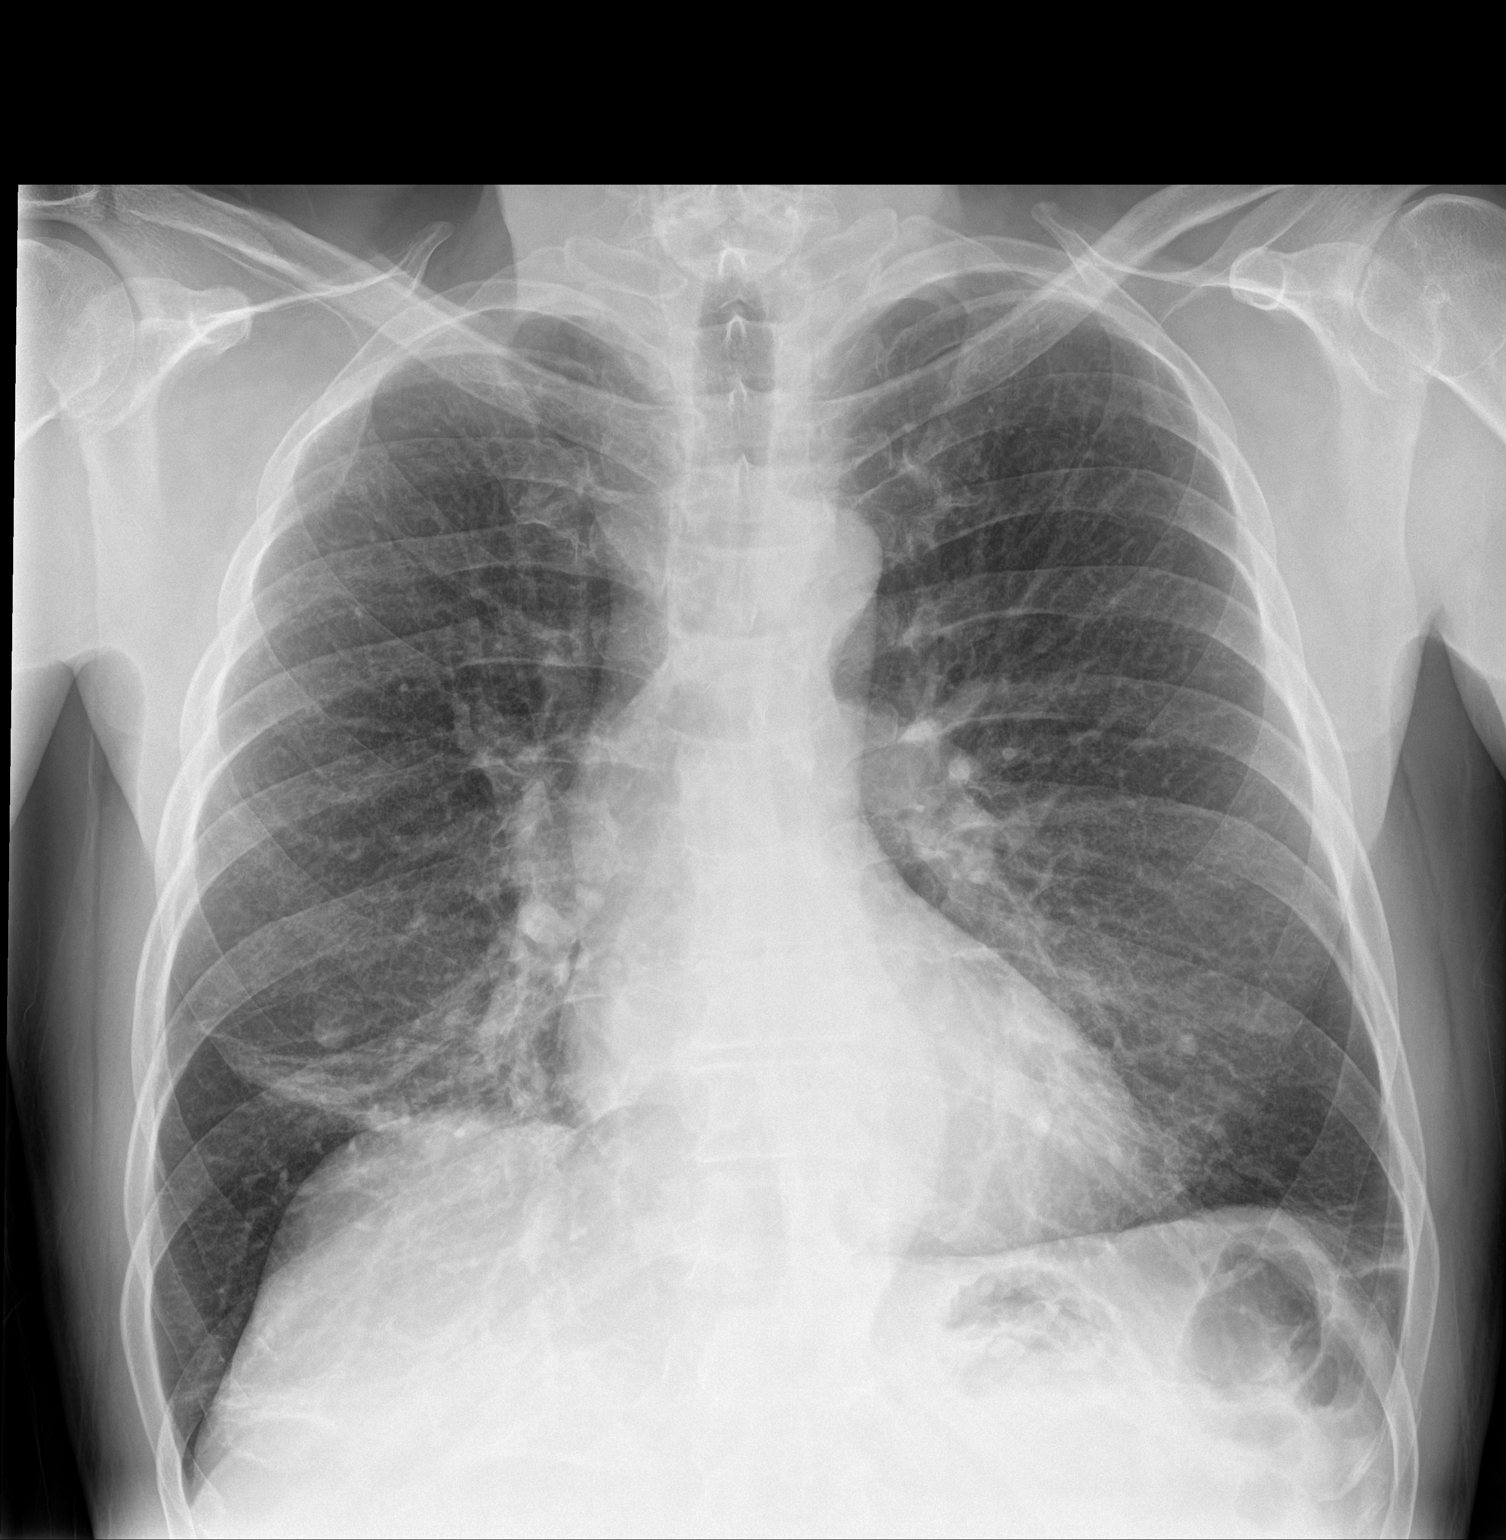

[chest lat]
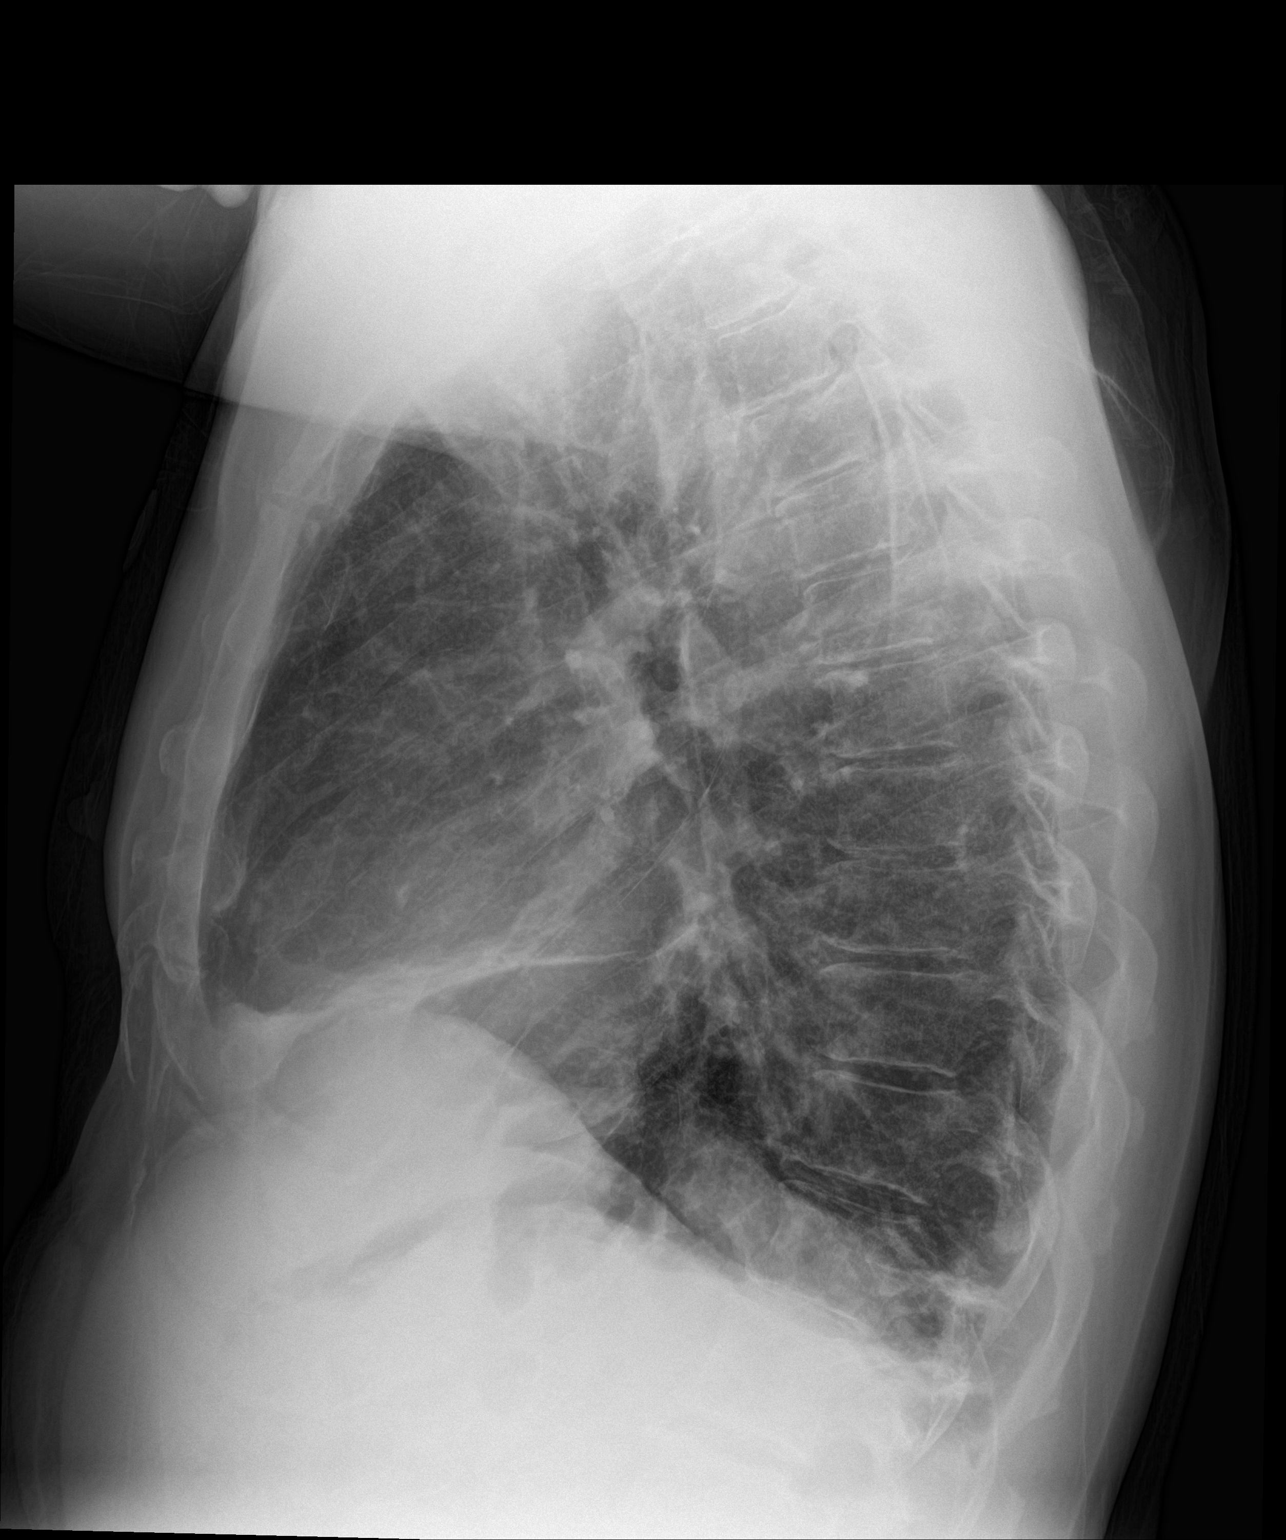

[2 of 2 positions shown; findings below may reference images not displayed]

FINDINGS: Heart size is normal. No pleural effusion or edema. Atelectasis
versus scar in the right base is unchanged from previous exam.
Coarsened interstitial markings are noted within both lungs
compatible with COPD/ emphysema. Left anterior ninth rib fracture is
identified likely accounting for the patient's pain.
IMPRESSION: 1. Left anterior ninth rib fracture.
2. Right base scar versus atelectasis.
